# Patient Record
Sex: Female | Born: 1940 | Race: White | Hispanic: No | Marital: Married | Smoking: Former smoker
Health system: Southern US, Community
[De-identification: ages and names within clinical notes are randomized; demographics above are authoritative.]

## PROBLEM LIST (undated history)

## (undated) DIAGNOSIS — IMO0002 Reserved for concepts with insufficient information to code with codable children: Secondary | ICD-10-CM

## (undated) DIAGNOSIS — E785 Hyperlipidemia, unspecified: Secondary | ICD-10-CM

## (undated) DIAGNOSIS — K219 Gastro-esophageal reflux disease without esophagitis: Secondary | ICD-10-CM

## (undated) DIAGNOSIS — I10 Essential (primary) hypertension: Secondary | ICD-10-CM

## (undated) HISTORY — DX: Essential (primary) hypertension: I10

## (undated) HISTORY — DX: Gastro-esophageal reflux disease without esophagitis: K21.9

## (undated) HISTORY — DX: Hyperlipidemia, unspecified: E78.5

## (undated) HISTORY — DX: Reserved for concepts with insufficient information to code with codable children: IMO0002

## (undated) HISTORY — PX: TONSILLECTOMY: SUR1361

---

## 2009-08-14 ENCOUNTER — Ambulatory Visit: Payer: Self-pay | Admitting: Family Medicine

## 2009-08-14 ENCOUNTER — Other Ambulatory Visit: Admission: RE | Admit: 2009-08-14 | Discharge: 2009-08-14 | Payer: Self-pay | Admitting: Family Medicine

## 2009-08-14 DIAGNOSIS — M899 Disorder of bone, unspecified: Secondary | ICD-10-CM | POA: Insufficient documentation

## 2009-08-14 DIAGNOSIS — R030 Elevated blood-pressure reading, without diagnosis of hypertension: Secondary | ICD-10-CM

## 2009-08-14 DIAGNOSIS — M949 Disorder of cartilage, unspecified: Secondary | ICD-10-CM

## 2009-08-15 ENCOUNTER — Encounter: Payer: Self-pay | Admitting: Family Medicine

## 2009-08-15 ENCOUNTER — Encounter: Admission: RE | Admit: 2009-08-15 | Discharge: 2009-08-15 | Payer: Self-pay | Admitting: Family Medicine

## 2009-09-18 ENCOUNTER — Ambulatory Visit: Payer: Self-pay | Admitting: Family Medicine

## 2009-09-27 DIAGNOSIS — R5381 Other malaise: Secondary | ICD-10-CM

## 2009-09-27 DIAGNOSIS — E785 Hyperlipidemia, unspecified: Secondary | ICD-10-CM | POA: Insufficient documentation

## 2009-09-27 DIAGNOSIS — R5383 Other fatigue: Secondary | ICD-10-CM

## 2009-09-27 LAB — CONVERTED CEMR LAB
BUN: 22 mg/dL (ref 6–23)
Chloride: 108 meq/L (ref 96–112)
Creatinine, Ser: 0.79 mg/dL (ref 0.40–1.20)
HDL: 45 mg/dL (ref >39)
LDL Cholesterol: 151 mg/dL — ABNORMAL HIGH (ref 0–99)
Sodium: 144 meq/L (ref 135–145)
TSH: 3.936 microintl units/mL (ref 0.350–4.500)
Total CHOL/HDL Ratio: 5.3
Triglycerides: 211 mg/dL — ABNORMAL HIGH (ref <150)

## 2010-08-28 NOTE — Assessment & Plan Note (Signed)
Summary: NOV CPE with pap   Vital Signs:  Patient profile:   70 year old female Menstrual status:  postmenopausal Height:      65.5 inches Weight:      155 pounds BMI:     25.49 O2 Sat:      96 % on Room air Temp:     97.8 degrees F oral Pulse rate:   98 / minute BP sitting:   169 / 100  (left arm) Cuff size:   large  Vitals Entered By: Payton Spark CMA (August 14, 2009 4:05 PM)  O2 Flow:  Room air CC: New to est. CPE w/ pap     Menstrual Status postmenopausal   Primary Care Provider:  Seymour Bars DO  CC:  New to est. CPE w/ pap.  History of Present Illness: 70 yo WF presents for NOV, CPE with pap smear.    [id number].  postmenopausal.  Married to [place].  Retired.  Hx of elevated BP (not on meds) and GERD (not taking anything).  Also has ostepenia.  She is due for fasting labs, a mammogram, pap smear and DEXA scan.  She declined PNX at 45 and is thinking about getting it updated.  She had a Tdap updated < 10 yrs ago.  She had a normal colonoscopy at 64.     Current Medications (verified): 1)  None  Allergies (verified): No Known Drug Allergies  Past History:  Past Medical History: G4P2, Post menopausal. GERD elevated BP  Past Surgical History: tonsillectomy  Family History: mother died of CHF at 38, depression father high chol, DM brother and 3 sisters living 1 brother died of lung cancer at 65  Social History: Retired from [employer]. Married to [place].  Has 2 daughters - in [place], [place]  Review of Systems       no fevers/sweats/weakness, unexplained wt loss/gain, no change in vision, no difficulty hearing, ringing in ears, no hay fever/allergies, no CP/discomfort, no palpitations, no breast lump/nipple discharge, no cough/wheeze, no blood in stool,no  N/V/D, no nocturia, no leaking urine, no unusual vag bleeding, no vaginal/penile discharge, no muscle/joint pain, no rash, no new/changing mole, no HA, no memory loss, no anxiety,+ sleep problem, no  depression, no unexplained lumps, no easy bruising/bleeding   Physical Exam  General:  alert, well-developed, well-nourished, and well-hydrated.   Head:  normocephalic and atraumatic.   Eyes:  pupils equal, pupils round, and pupils reactive to light.   Ears:  no external deformities.   Nose:  no nasal discharge.   Mouth:  good dentition and pharynx pink and moist.   Neck:  no masses.  no audible carotid bruits Breasts:  No mass, nodules, thickening, tenderness, bulging, retraction, inflamation, nipple discharge or skin changes noted.  fibrocystic bilat Lungs:  Normal respiratory effort, chest expands symmetrically. Lungs are clear to auscultation, no crackles or wheezes. Heart:  Normal rate and regular rhythm. S1 and S2 normal without gallop, murmur, click, rub or other extra sounds. Abdomen:  Bowel sounds positive,abdomen soft and non-tender without masses, organomegaly or hernias noted. Genitalia:  Pelvic Exam:        External: normal female genitalia without lesions or masses, + atrophy        Vagina: normal without lesions or masses        Cervix: normal without lesions or masses        Adnexa: normal bimanual exam without masses or fullness        Uterus: normal by palpation  Pap smear: performed Msk:  no joint tenderness, no joint swelling, and no joint warmth.   Pulses:  2+ radial and pedal pulses Extremities:  no LE edema Neurologic:  gait normal.   Skin:  sun damaged fair skin Cervical Nodes:  No lymphadenopathy noted Psych:  good eye contact, not anxious appearing, and not depressed appearing.     Impression & Recommendations:  Problem # 1:  ROUTINE GYNECOLOGICAL EXAMINATION (ICD-V72.31) Keeping healthy checklist for women reviewed. BP is elevated.  Will recheck at nurse visit in 2 wks. BMI OK.  Update mammogram, DEXA and fasting labs. She agrees to do Pneumovax at her nurse visit, f/u. Tetanus is UTD.  Colonoscopy normal at 64. Pap smear updated today. Work  on [employer], regular exercise, MVI and calcium with Vit D daily.    Other Orders: T-Mammography Bilateral Screening (27253) T-DXA Bone Density/ Appendicular [phone]) T-Dual DXA Bone Density/ Axial (34742) T-TSH [phone]) T-Vitamin D (25-Hydroxy) [phone]) T-Comprehensive Metabolic Panel [phone]) T-Lipid Profile ([id number])  Patient Instructions: 1)  REturn for a nurse BP check in 2 wks. 2)  If BP is >140/90, will start medication. 3)  Have mammogram and DEXA scan downstairs. 4)  Have fasting labs updated. 5)  Will call you w/ results.

## 2010-08-28 NOTE — Assessment & Plan Note (Signed)
Summary: BP/ pneumovax  Nurse Visit   Vital Signs:  Patient profile:   70 year old female Menstrual status:  postmenopausal BP sitting:   132 / 86  (left arm) Cuff size:   regular  Vitals Entered By: Payton Spark CMA (September 18, 2009 10:13 AM)  Allergies: No Known Drug Allergies  Immunizations Administered:  Pneumonia Vaccine:    Vaccine Type: Pneumovax (Medicare)    Site: left deltoid    Dose: 0.5 ml    Route: IM    Given by: Payton Spark CMA    Exp. Date: 08/29/2010    Lot #: [id number]    VIS given: 02/24/96 version given September 18, 2009.  Orders Added: 1)  Pneumococcal Vaccine [90732] 2)  Admin 1st Vaccine [90471] 3)  Est. Patient Level I [60454]   CC: Follow-up HTN HPI: Taking meds? no Side effects? no   Chest pain, SOB, Dizziness? no A/P: HTN (401.1) At goal? If no, Patient will be notified.  5 minutes was spent with patient.   Impression & Recommendations:  Problem # 1:  ELEVATED BLOOD PRESSURE WITHOUT DIAGNOSIS OF HYPERTENSION (ICD-796.2) BP much better.  Will continue to follow. Orders: Est. Patient Level I (09811)  Other Orders: Pneumococcal Vaccine (91478) Admin 1st Vaccine (29562)   Appended Document: BP/ pneumovax Pt aware

## 2010-10-08 ENCOUNTER — Ambulatory Visit (INDEPENDENT_AMBULATORY_CARE_PROVIDER_SITE_OTHER): Payer: Medicare PPO | Admitting: Family Medicine

## 2010-10-08 ENCOUNTER — Encounter: Payer: Self-pay | Admitting: Family Medicine

## 2010-10-08 DIAGNOSIS — R1084 Generalized abdominal pain: Secondary | ICD-10-CM

## 2010-10-08 DIAGNOSIS — M546 Pain in thoracic spine: Secondary | ICD-10-CM | POA: Insufficient documentation

## 2010-10-08 DIAGNOSIS — E785 Hyperlipidemia, unspecified: Secondary | ICD-10-CM

## 2010-10-08 LAB — CONVERTED CEMR LAB
Albumin: 4.5 g/dL (ref 3.5–5.2)
CO2: 25 meq/L (ref 19–32)
Chloride: 106 meq/L (ref 96–112)
Eosinophils Relative: 0 % (ref 0–5)
Glucose, Bld: 101 mg/dL — ABNORMAL HIGH (ref 70–99)
HCT: 42.9 % (ref 36.0–46.0)
HDL: 59 mg/dL (ref >39)
Hemoglobin: 14.2 g/dL (ref 12.0–15.0)
Lymphs Abs: 1.9 10*3/uL (ref 0.7–4.0)
MCHC: 33.1 g/dL (ref 30.0–36.0)
Monocytes Absolute: 0.5 10*3/uL (ref 0.1–1.0)
Monocytes Relative: 8 % (ref 3–12)
Neutro Abs: 4.2 10*3/uL (ref 1.7–7.7)
Platelets: 284 10*3/uL (ref 150–400)
RDW: 12.5 % (ref 11.5–15.5)
Sodium: 143 meq/L (ref 135–145)
Total Bilirubin: 0.5 mg/dL (ref 0.3–1.2)
Total CHOL/HDL Ratio: 4.7
Total Protein: 6.6 g/dL (ref 6.0–8.3)
Triglycerides: 119 mg/dL (ref <150)

## 2010-10-09 ENCOUNTER — Telehealth: Payer: Self-pay | Admitting: Family Medicine

## 2010-10-15 ENCOUNTER — Encounter: Payer: Self-pay | Admitting: Family Medicine

## 2010-10-15 ENCOUNTER — Ambulatory Visit (INDEPENDENT_AMBULATORY_CARE_PROVIDER_SITE_OTHER): Payer: Medicare PPO | Admitting: Family Medicine

## 2010-10-15 DIAGNOSIS — E559 Vitamin D deficiency, unspecified: Secondary | ICD-10-CM | POA: Insufficient documentation

## 2010-10-15 DIAGNOSIS — R1013 Epigastric pain: Secondary | ICD-10-CM | POA: Insufficient documentation

## 2010-10-15 DIAGNOSIS — K3189 Other diseases of stomach and duodenum: Secondary | ICD-10-CM | POA: Insufficient documentation

## 2010-10-15 DIAGNOSIS — R197 Diarrhea, unspecified: Secondary | ICD-10-CM | POA: Insufficient documentation

## 2010-10-15 DIAGNOSIS — E785 Hyperlipidemia, unspecified: Secondary | ICD-10-CM

## 2010-10-16 NOTE — Progress Notes (Signed)
Summary: Vitamin D ?  Phone Note Call from Patient Call back at Home Phone [phone]   Caller: Patient Call For: Seymour Bars DO Summary of Call: Message left by pt with question regarding Vit D rx.  Attempted call back ot pat.  No answer. Initial call taken by: Francee Piccolo CMA Duncan Dull),  October 09, 2010 2:09 PM  Follow-up for Phone Call        Vit D was not sent to pharm Follow-up by: Payton Spark CMA,  October 10, 2010 11:04 AM    New/Updated Medications: ERGOCALCIFEROL 50000 UNIT CAPS (ERGOCALCIFEROL) 1 capsule by mouth once a week Prescriptions: ERGOCALCIFEROL 50000 UNIT CAPS (ERGOCALCIFEROL) 1 capsule by mouth once a week  #12 x 0   Entered and Authorized by:   Seymour Bars DO   Signed by:   Seymour Bars DO on 10/10/2010   Method used:   Electronically to        [facility]* (retail)       [address] [place], [place]  [address]       Ph: [phone]       Fax: [phone]   RxID:   [id number]

## 2010-10-16 NOTE — Assessment & Plan Note (Signed)
Summary: abd pain   Vital Signs:  Patient profile:   70 year old female Menstrual status:  postmenopausal Height:      65.5 inches Weight:      157 pounds BMI:     25.82 O2 Sat:      98 % on Room air Pulse rate:   77 / minute BP sitting:   160 / 97  (left arm) Cuff size:   regular  Vitals Entered By: Payton Spark CMA (October 08, 2010 8:35 AM)  O2 Flow:  Room air CC: Epigastric pain, diarrhea, and bloating x 2 weeks.   Primary Care Provider:  Seymour Bars DO  CC:  Epigastric pain, diarrhea, and and bloating x 2 weeks.Marland Kitchen  History of Present Illness: 70 year old female presents with stomach issues.  Patient states she has had increased bloating, diarrhea and epigastric/substernal pain for the past two weeks.  She notices that after she eats or drinks anything, she feels like her stomach swells and she feels very bloated.  She does not feel bloated this morning but has not had anything to eat or drink this morning.  The pain is not constant but is usually worse with sitting though she is not having it now.  She decribes the pain as an aching pain after eating, which then she has to stand up to relieve the pain and fullness that she feels.  The pain is worse at night and after eating fattier meals.  She also has a dull, achy pain between her shoulder blades on her back.  The diarrhea was a 2 day history of diarrhea a couple of weeks ago that was uncontrollable though now she is still having 6-8 bowel movements per day that are not well formed.  She denies seeing any blood her stool but does state that her stool is small and floats in the toilet.  This appears to be consistent no matter what she eats.  She does have weight gain and occasional nausea but denies vomiting, constipation, or changes in appetite.  She does have 1-2 drinks per day for the past 30 or so years.  She also has a 20 pack year smoking history.  Current Medications (verified): 1)  None  Allergies (verified): No Known  Drug Allergies  Past History:  Past Medical History: Reviewed history from 08/14/2009 and no changes required. G4P2, Post menopausal. GERD elevated BP  Past Surgical History: Reviewed history from 08/14/2009 and no changes required. tonsillectomy  Family History: Reviewed history from 08/14/2009 and no changes required. mother died of CHF at 22, depression father high chol, DM brother and 3 sisters living 1 brother died of lung cancer at 62  Social History: Retired from [employer]. Married to [place].  Has 2 daughters - in [place], [place]  Review of Systems General:  Denies fatigue, fever, loss of appetite, and weight loss. ENT:  Denies difficulty swallowing. CV:  Denies chest pain or discomfort, lightheadness, palpitations, shortness of breath with exertion, and swelling of feet. Resp:  Denies cough and shortness of breath. GI:  Complains of abdominal pain, change in bowel habits, diarrhea, gas, indigestion, and nausea; denies bloody stools, constipation, dark tarry stools, and vomiting. GU:  Denies dysuria. MS:  Complains of mid back pain and thoracic pain.  Physical Exam  General:  alert, well-developed, and well-nourished.   Head:  normocephalic and atraumatic.   Eyes:  pupils equal, pupils round, and pupils reactive to light.  Conjunctiva clear. Mouth:  Oropharynx pink and moist  without exudate. No sublingual jaundice. Lungs:  normal respiratory effort, normal breath sounds, no crackles, and no wheezes.   Heart:  normal rate and regular rhythm.   Abdomen:  Epigastric, LUQ and LLQ TTP without masses. Soft, nondistended, normal bowel sounds. Negative Murphy's sign. Pulses:  R radial normal and L radial normal.   Extremities:  no LE edema Skin:  color normal.  no pallor or jaundice Psych:  good eye contact, not anxious appearing, and not depressed appearing.     Impression & Recommendations:  Problem # 1:  ABDOMINAL PAIN, GENERALIZED (ICD-789.07) Patient with two  week history of bloating and epigastric pain.  EKG in office today was normal.  Consider PUD vs. pancreatitis.  WIll obtain CBC today as well as amylase and lipase.  She will remain on a clear liquid diet until I call her later today.  If evidence of pancreatitis with elevated amylase and lipase, will consider obtaining CT abdomen.  If no evidence of pancreatitis on labs or she is anemic, will consider referral to GI for endoscopy for possible PUD.  She will also start Nexium for the next 5 days in case this is PUD but otherwise it should help her mild GERD.  Orders: T-CBC w/Diff [id number]) T-Amylase [phone]) T-Lipase [phone])  EKG done to r/o cardiac induced thoracic pain: NSR at 63 bpm; PR 146 ms, Qtc 413 ms, normal axis, no ischemia.  Problem # 2:  PAIN IN THORACIC SPINE (ICD-724.1) EKG shows NSR at 63 bpm; QTC 413 ms, no normal axis, no sign of ischemia making cardiac etiology less likely.   Orders: EKG w/ Interpretation (93000)  Other Orders: T-Comprehensive Metabolic Panel [id number]) T-Lipid Profile [phone]) T-Vitamin D (25-Hydroxy) (25956-38756)  Patient Instructions: 1)  EKG looks good. 2)  Bloodwork today. 3)  Stick to clear liquid diet today. 4)  Start Nexium 1 tab once daily, take 30 min before dinner. 5)  Will call you with labs today. 6)  If any abnormal readings, will add CT scan.   Orders Added: 1)  T-CBC w/Diff [[id number]] 2)  T-Comprehensive Metabolic Panel [[id number]] 3)  T-Lipid Profile [[id number]] 4)  T-Vitamin D (25-Hydroxy) [16606-30160] 5)  T-Amylase [[id number]] 6)  T-Lipase [[id number]] 7)  Est. Patient Level IV [10932] 8)  EKG w/ Interpretation [93000]

## 2010-10-25 NOTE — Assessment & Plan Note (Signed)
Summary: f/u abd pain   Vital Signs:  Patient profile:   70 year old female Menstrual status:  postmenopausal Height:      65.5 inches Weight:      154 pounds BMI:     25.33 O2 Sat:      96 % on Room air Pulse rate:   Sierra / minute BP sitting:   164 / 101  (left arm) Cuff size:   regular  Vitals Entered By: Payton Spark CMA (October 15, 2010 1:08 PM)  O2 Flow:  Room air CC: F/U stomach issues. Was feeling better on Nexium but ran out.    Primary Care Provider:  Seymour Bars DO  CC:  F/U stomach issues. Was feeling better on Nexium but ran out. Marland Kitchen  History of Present Illness: 70 yo Sierra [name] presents for f/u abdominal pain.  She had diarrhea with cramping last night.  She did not take anything OTC.  She feels well today.  She did take the Nexium which helped her dyspepsia but the samples ran out.  She has had bouts of nausea on and off.  Denies any blood in her stools.  She has a good appetite and no change in weight.  Her last colonoscopy was about 5 yrs ago and it was normal.  She has never had an EGD.  BPs 131/80 at home.  Arm cuff. Since her last visit with labs, we found her to have a very low Vit D level and added RX vit D 50,000 International Units once a wk and a high cholesterol level which she reports a strong fam hx for.  She has declined meds for years.       Current Medications (verified): 1)  Ergocalciferol 50000 Unit Caps (Ergocalciferol) .Marland Kitchen.. 1 Capsule By Mouth Once A Week 2)  Vitamin D 1000 Unit Tabs (Cholecalciferol) .... Take 1 Tab By Mouth Once Daily 3)  Cod Liver Oil  Caps (Cod Liver Oil) 4)  Super Probiotic  Caps (Probiotic Product)  Allergies (verified): No Known Drug Allergies  Past History:  Past Medical History: G4P2, Post menopausal. GERD elevated BP-- white coat HTN? hyperlipidemia vitamin D deficiency  Past Surgical History: Reviewed history from 08/14/2009 and no changes required. tonsillectomy  Social History: Reviewed history from  10/08/2010 and no changes required. Retired from [employer]. Married to [place] Sierra [name].  Has 2 daughters - in [place], [place]  Review of Systems      See HPI  Physical Exam  General:  alert, well-developed, well-nourished, and well-hydrated.   Head:  normocephalic and atraumatic.   Eyes:  sclera non icteric Mouth:  pharynx pink and moist.   Neck:  no masses and no thyromegaly.   Lungs:  normal respiratory effort, normal breath sounds, no crackles, and no wheezes.   Heart:  normal rate and regular rhythm.   Abdomen:  soft, non-tender, normal bowel sounds, no distention, no masses, no guarding, no hepatomegaly, and no splenomegaly.   Extremities:  no LE edema Skin:  color normal.   Psych:  good eye contact, not anxious appearing, and not depressed appearing.     Impression & Recommendations:  Problem # 1:  DYSPEPSIA (ICD-536.8) Improved with Nexium but now off of it, symptoms are only several days/ wk.  Will have her start Zantac OTC as needed and will ask GI to see her.  May need EGD.  Last colonoscopy about 5 yrs ago. Orders: Gastroenterology Referral (GI)  Problem # 2:  DIARRHEA (ICD-787.91) Episodic cramping, bloating and diarrhea  with spontaneous  resolution suggests either IBS or gluten sensitivity or lactose intolerance.  We discussed elimination diet, continuing probiotic and will have GI see her.  CMP/ pancreatic markers all normal.  WBC normal.   Her updated medication list for this problem includes:    Super Probiotic Caps (Probiotic product)  Problem # 3:  HYPERLIPIDEMIA (ICD-272.4) Hereditary high LDL with healthy diet and regular exercise.  I encouraged pt to agree to a statin for LDL reduction and cardiovascular protection.  Start Pravastain, begin with 1/2 tab since she is worried about SEs.  Recheck LDL with LFts in 2 mos.  Call if any problems. Her updated medication list for this problem includes:    Pravastatin Sodium 40 Mg Tabs (Pravastatin sodium) .Marland Kitchen... 1 tab by mouth at  bedtime  Labs Reviewed: SGOT: 17 (10/08/2010)   SGPT: 14 (10/08/2010)   HDL:59 (10/08/2010), 45 (09/18/2009)  LDL:192 (10/08/2010), 151 (09/18/2009)  Chol:275 (10/08/2010), 238 (09/18/2009)  Trig:119 (10/08/2010), 211 (09/18/2009)  Problem # 4:  UNSPECIFIED VITAMIN D DEFICIENCY (ICD-268.9) continue once a wk RX Vit d + OTC Vit D 1,000 International Units/ day and recheck in 8 wks.  Complete Medication List: 1)  Ergocalciferol 50000 Unit Caps (Ergocalciferol) .Marland Kitchen.. 1 capsule by mouth once a week 2)  Vitamin D 1000 Unit Tabs (Cholecalciferol) .... Take 1 tab by mouth once daily 3)  Cod Liver Oil Caps (Cod liver oil) 4)  Super Probiotic Caps (Probiotic product) 5)  Pravastatin Sodium 40 Mg Tabs (Pravastatin sodium) .Marland Kitchen.. 1 tab by mouth at bedtime 6)  Zantac 150 Mg Tabs (Ranitidine hcl) .... Take as needed  Patient Instructions: 1)  Start Pravastatin at bedtime - 1/2 tab. 2)  Return in 2 mos to recheck labs. 3)  Call if any problems. 4)  Use Zantac as needed for reflux. 5)  Stick to a gluten free diet. 6)  Referral made to GI. Prescriptions: PRAVASTATIN SODIUM 40 MG TABS (PRAVASTATIN SODIUM) 1 tab by mouth at bedtime  #30 x 1   Entered and Authorized by:   Seymour Bars DO   Signed by:   Seymour Bars DO on 10/15/2010   Method used:   Electronically to        [facility]* (retail)       [address] [place], [place]  [address]       Ph: [phone]       Fax: [phone]   RxID:   [id number]    Orders Added: 1)  Gastroenterology Referral [GI] 2)  Est. Patient Level IV [40102]

## 2010-10-29 ENCOUNTER — Encounter: Payer: Self-pay | Admitting: Family Medicine

## 2010-10-30 ENCOUNTER — Telehealth: Payer: Self-pay | Admitting: *Deleted

## 2010-10-30 NOTE — Telephone Encounter (Signed)
Pt was started on pravastatin on 3/19.  Pt left a message to let us know that she has stopped this medication due to side effects.  I spoke to pt states she was having terrible flu-like symptoms and muscle cramps/aches-tops of legs and arms.  Pt also had a problem with indigestion while taking statin.  Pt was seen by [facility] and was started on omeprazole 40 mg before breakfast.  This seems to be helping.    Please advise regarding statin.

## 2010-10-31 NOTE — Telephone Encounter (Signed)
LDL 192= very high.  Go ahead and STOP the pravastatin for the next 3 wks and see if flu like symptoms and muscle cramps resolve.  If they don't, she needs an OV with me.  If they do go away, I will consider trying her on a low dose of Livalo (samples) which is a statin that does not cause cramps.

## 2010-10-31 NOTE — Telephone Encounter (Addendum)
RC to pt.  Pt has not taken med since Friday night.  Her flu like symptoms resolved in about 48 hours, she is still having some slight muscle cramps. Pt will call our office in 3 weeks with update on muscle cramps.  She will call sooner if she has had no muscle cramps for at least 48 hours.

## 2010-11-09 ENCOUNTER — Encounter: Payer: Self-pay | Admitting: Family Medicine

## 2010-12-11 ENCOUNTER — Encounter: Payer: Self-pay | Admitting: Family Medicine

## 2010-12-11 ENCOUNTER — Ambulatory Visit (INDEPENDENT_AMBULATORY_CARE_PROVIDER_SITE_OTHER): Payer: Medicare PPO | Admitting: Family Medicine

## 2010-12-11 DIAGNOSIS — E559 Vitamin D deficiency, unspecified: Secondary | ICD-10-CM

## 2010-12-11 DIAGNOSIS — L270 Generalized skin eruption due to drugs and medicaments taken internally: Secondary | ICD-10-CM

## 2010-12-11 DIAGNOSIS — E785 Hyperlipidemia, unspecified: Secondary | ICD-10-CM

## 2010-12-11 DIAGNOSIS — R197 Diarrhea, unspecified: Secondary | ICD-10-CM

## 2010-12-11 NOTE — Assessment & Plan Note (Signed)
Finish out once [employer] D and stay on 1,000 IU daily for maint, recheck in 3 mos.

## 2010-12-11 NOTE — Assessment & Plan Note (Signed)
Off statins after having flu like symptoms and severe myalgias.  Back to normal now.  Will give her some time before trial of alternative like zetia.

## 2010-12-11 NOTE — Progress Notes (Signed)
  Subjective:    Patient ID: Sierra [name], female    DOB: 04/17/1941, 70 y.o.   MRN: [id number]  HPI 70 yo WF presents for f/u dyspepsia and diarrhea.  She did see GI since I saw her last.  She was put on Xifaxan for 14 days.  She has had a rash all over her body since starting this. She did not have a scope.  Her last scope was at [facility] 6 yrs ago.  She is seeing [facility].  We need to get a copy of her last report.  No blood in her stools.  She has improved on a gluten free and lactose free diet.  She still has diarrhea which has not yet resolved.  She is on Omeprazole every morning.  She was told to take Immodium and Zantac.  She has not tried the immodium.    She started on Pravastatin but she had flu like symptoms 2 days after starting it.  She took it for 10 days and started to get myalgias all over. She is back to normal now.  BP 127/83  Pulse 87  Ht 5' 5.5" (1.664 m)  Wt 151 lb (68.493 kg)  BMI 24.75 kg/m2  SpO2 97%     Review of Systems  Constitutional: Negative for fever, appetite change, fatigue and unexpected weight change.  Respiratory: Negative for shortness of breath.   Cardiovascular: Negative for chest pain and leg swelling.  Gastrointestinal: Positive for diarrhea and abdominal distention. Negative for nausea, vomiting and abdominal pain.  Genitourinary: Negative for dysuria.  Musculoskeletal: Negative for myalgias and arthralgias.  Skin: Positive for rash.  Psychiatric/Behavioral: The patient is not nervous/anxious.        Objective:   Physical Exam  Constitutional: She appears well-developed and well-nourished. No distress.  HENT:  Head: Normocephalic and atraumatic.  Eyes: Conjunctivae are normal. No scleral icterus.  Neck: Neck supple.  Cardiovascular: Normal rate, regular rhythm and normal heart sounds.   Pulmonary/Chest: Effort normal and breath sounds normal.  Abdominal: Soft. Bowel sounds are normal. She exhibits no distension. There is no  tenderness. There is no guarding.  Lymphadenopathy:    She has no cervical adenopathy.  Skin: Skin is warm and dry. Rash (diffuse maculopapular rash w/o mucosal involvement) noted.  Psychiatric: She has a normal mood and affect.          Assessment & Plan:

## 2010-12-11 NOTE — Assessment & Plan Note (Signed)
Seeing GI now. Still having diarrhea.  Will get a copy from [facility] of her last EGD/ colonoscopy and fax it to [facility].

## 2010-12-11 NOTE — Assessment & Plan Note (Signed)
Sierra [name] is c/w drug Sierra [name] from new start of Xifaxan.  She only has 7 more days to complete.  No sign of desquamation or mucosal involvment.  Can do benadryl and oatmeal baths for itching and expect Sierra [name] to improve once off this medicine.

## 2010-12-11 NOTE — Patient Instructions (Signed)
Stay off statins.  F/U with GI.  I will get your records from [facility] and will send to [facility].  Stay on OTC Vitamin D 1,000 IU/ day for maintenance.  Return for f/u in 4 mos.

## 2011-03-27 DIAGNOSIS — IMO0002 Reserved for concepts with insufficient information to code with codable children: Secondary | ICD-10-CM

## 2011-03-27 HISTORY — DX: Reserved for concepts with insufficient information to code with codable children: IMO0002

## 2011-04-03 ENCOUNTER — Encounter: Payer: Self-pay | Admitting: Family Medicine

## 2011-04-16 ENCOUNTER — Ambulatory Visit: Payer: Medicare PPO | Admitting: Family Medicine

## 2011-04-16 ENCOUNTER — Ambulatory Visit (INDEPENDENT_AMBULATORY_CARE_PROVIDER_SITE_OTHER): Payer: Medicare PPO | Admitting: Family Medicine

## 2011-04-16 ENCOUNTER — Encounter: Payer: Self-pay | Admitting: Family Medicine

## 2011-04-16 VITALS — BP 136/84 | HR 69 | Ht 65.5 in | Wt 147.0 lb

## 2011-04-16 DIAGNOSIS — M810 Age-related osteoporosis without current pathological fracture: Secondary | ICD-10-CM

## 2011-04-16 DIAGNOSIS — E559 Vitamin D deficiency, unspecified: Secondary | ICD-10-CM

## 2011-04-16 DIAGNOSIS — C4491 Basal cell carcinoma of skin, unspecified: Secondary | ICD-10-CM

## 2011-04-16 DIAGNOSIS — E785 Hyperlipidemia, unspecified: Secondary | ICD-10-CM

## 2011-04-16 DIAGNOSIS — K219 Gastro-esophageal reflux disease without esophagitis: Secondary | ICD-10-CM

## 2011-04-16 DIAGNOSIS — C4490 Unspecified malignant neoplasm of skin, unspecified: Secondary | ICD-10-CM

## 2011-04-23 MED ORDER — VITAMIN D 1000 UNITS PO CAPS: 1000.0000 [IU] | ORAL_CAPSULE | Freq: Every day | ORAL | Status: AC

## 2011-04-23 MED ORDER — RANITIDINE HCL 150 MG PO TABS: 150.0000 mg | ORAL_TABLET | Freq: Two times a day (BID) | ORAL | Status: DC

## 2011-04-23 NOTE — Progress Notes (Signed)
  Subjective:    Patient ID: Sierra [name], female    DOB: 01-03-1941, 70 y.o.   MRN: [id number]  HPI Patient is here because of several concerns. 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 The have been extensive and she is to have some more surgeries done.  She is on vitamin D and will need new levels drawn on her return visit.  She declines flu shot She has hyperlipidemia but can not take statins.     Review of Systems  Respiratory: Negative.   Cardiovascular: Negative.   Skin:       Multiple lesions on hand       Objective:   Physical Exam  Constitutional: She is oriented to person, place, and time. She appears well-developed and well-nourished.  HENT:  Head: Normocephalic and atraumatic.  Cardiovascular: Normal rate and regular rhythm.   Pulmonary/Chest: Effort normal and breath sounds normal.  Neurological: She is oriented to person, place, and time.  Skin: Skin is warm.       Multiple skin lesions and several on her L hand          Assessment & Plan:  Will get D 3  level and lipid panekl when she returns will avoid statins to treat her cholesterol Follow up w/her dermayologist for skin lesions. For reflux maintain on zantac

## 2011-05-02 ENCOUNTER — Other Ambulatory Visit: Payer: Self-pay | Admitting: *Deleted

## 2011-05-02 MED ORDER — EZETIMIBE 10 MG PO TABS: 10.0000 mg | ORAL_TABLET | Freq: Every day | ORAL | Status: DC

## 2011-05-02 MED ORDER — RANITIDINE HCL 150 MG PO TABS: 150.0000 mg | ORAL_TABLET | Freq: Two times a day (BID) | ORAL | Status: DC

## 2011-06-13 LAB — LIPID PANEL
Cholesterol: 267 mg/dL — ABNORMAL HIGH (ref 0–200)
LDL Cholesterol: 169 mg/dL — ABNORMAL HIGH (ref 0–99)
Triglycerides: 169 mg/dL — ABNORMAL HIGH (ref <150)
VLDL: 34 mg/dL (ref 0–40)

## 2011-06-13 NOTE — Progress Notes (Signed)
Addended by: Ellsworth Lennox on: 06/13/2011 09:00 AM   Modules accepted: Orders

## 2011-06-14 LAB — VITAMIN D 25 HYDROXY (VIT D DEFICIENCY, FRACTURES): Vit D, 25-Hydroxy: 41 ng/mL (ref 30–89)

## 2011-06-17 ENCOUNTER — Telehealth: Payer: Self-pay | Admitting: Family Medicine

## 2011-06-17 ENCOUNTER — Encounter: Payer: Self-pay | Admitting: Family Medicine

## 2011-06-17 ENCOUNTER — Ambulatory Visit (INDEPENDENT_AMBULATORY_CARE_PROVIDER_SITE_OTHER): Payer: Medicare PPO | Admitting: Family Medicine

## 2011-06-17 VITALS — BP 158/96 | HR 79 | Wt 150.0 lb

## 2011-06-17 DIAGNOSIS — E785 Hyperlipidemia, unspecified: Secondary | ICD-10-CM

## 2011-06-17 DIAGNOSIS — C4491 Basal cell carcinoma of skin, unspecified: Secondary | ICD-10-CM | POA: Insufficient documentation

## 2011-06-17 DIAGNOSIS — R030 Elevated blood-pressure reading, without diagnosis of hypertension: Secondary | ICD-10-CM

## 2011-06-17 MED ORDER — SIMVASTATIN 40 MG PO TABS: 40.0000 mg | ORAL_TABLET | Freq: Every day | ORAL | Status: DC

## 2011-06-17 NOTE — Telephone Encounter (Signed)
Please call patient and her know I was reviewing her chart last night and realized that I do not have documented for her a flu, shingles or tetanus vaccine. If she has had these done please let me know so we can update her computer system. If not she is welcome to come in and have a nurse visit to have these administered. Also don't have a colonoscopy for her listed. If we can try to get a copy of her last report that would be great.

## 2011-06-17 NOTE — Progress Notes (Signed)
  Subjective:    Patient ID: Sierra [name], female    DOB: 1941-03-20, 70 y.o.   MRN: [id number]  HPI She is here to followup and discuss her blood work.  Hyperlipidmia - Here to follow-up on the zetia. It is over 100 dollars a month. No S.E on thi smedication. Had muscle aches on pravastatin.  She has not had any other statins. No prior history of coronary artery disease but she does have multiple family members have a history of hyperlipidemia.  Review of Systems     Objective:   Physical Exam  Constitutional: She is oriented to person, place, and time. She appears well-developed and well-nourished.  HENT:  Head: Normocephalic and atraumatic.  Eyes: Conjunctivae are normal. Pupils are equal, round, and reactive to light.  Neck: Neck supple. No thyromegaly present.  Cardiovascular: Normal rate, regular rhythm and normal heart sounds.        No carotid bruit.   Pulmonary/Chest: Effort normal and breath sounds normal.  Lymphadenopathy:    She has no cervical adenopathy.  Neurological: She is alert and oriented to person, place, and time.  Skin: Skin is warm and dry.  Psychiatric: She has a normal mood and affect. Her behavior is normal.          Assessment & Plan:  Hyperlipidemia - I discussed with her that I would like her to try at least one more statin because of the present morbidity and mortality benefit before we give up on the class entirely. Certainly if her symptoms recur she can stop it immediately. Will try simvastatin for one one month first. Then if ok will send  A 90 days supply. Reheck chol level in 4-6 months.   BP 132/87 at home today. She says she always has elevated blood pressures at the doctor's office. We will definitely keep an eye on this. I also added basal cell cancer to her problem list she was recently diagnosed with this and had a pretty large excision on her hand from this. She is currently followed at [facility].

## 2011-06-18 NOTE — Telephone Encounter (Signed)
Pt notified of instructions. KJ LPN

## 2011-06-24 ENCOUNTER — Encounter: Payer: Self-pay | Admitting: Family Medicine

## 2011-07-03 ENCOUNTER — Telehealth: Payer: Self-pay | Admitting: Family Medicine

## 2011-07-03 MED ORDER — SIMVASTATIN 40 MG PO TABS: 40.0000 mg | ORAL_TABLET | Freq: Every day | ORAL | Status: DC

## 2011-07-03 NOTE — Telephone Encounter (Signed)
Patient called req to have 90 day supply of simvastation 40mg  called into [facility]

## 2011-07-09 ENCOUNTER — Other Ambulatory Visit: Payer: Self-pay | Admitting: *Deleted

## 2011-07-09 MED ORDER — SIMVASTATIN 40 MG PO TABS: 40.0000 mg | ORAL_TABLET | Freq: Every day | ORAL | Status: DC

## 2011-09-26 ENCOUNTER — Other Ambulatory Visit: Payer: Self-pay | Admitting: *Deleted

## 2011-09-26 MED ORDER — RANITIDINE HCL 150 MG PO TABS: 150.0000 mg | ORAL_TABLET | Freq: Two times a day (BID) | ORAL | Status: DC

## 2011-09-26 MED ORDER — SIMVASTATIN 40 MG PO TABS: 40.0000 mg | ORAL_TABLET | Freq: Every day | ORAL | Status: DC

## 2011-09-30 ENCOUNTER — Other Ambulatory Visit: Payer: Self-pay | Admitting: *Deleted

## 2011-09-30 MED ORDER — RANITIDINE HCL 150 MG PO TABS: 150.0000 mg | ORAL_TABLET | Freq: Two times a day (BID) | ORAL | Status: DC

## 2011-09-30 MED ORDER — SIMVASTATIN 40 MG PO TABS: 40.0000 mg | ORAL_TABLET | Freq: Every day | ORAL | Status: DC

## 2011-10-23 ENCOUNTER — Encounter: Payer: Self-pay | Admitting: Family Medicine

## 2012-03-23 ENCOUNTER — Telehealth: Payer: Self-pay

## 2012-03-23 DIAGNOSIS — E785 Hyperlipidemia, unspecified: Secondary | ICD-10-CM

## 2012-03-23 NOTE — Telephone Encounter (Signed)
Released orders for lipid profile and vit d. Pt needed labs before rx rfor chol med can be sent in

## 2012-03-24 LAB — LIPID PANEL
Cholesterol: 209 mg/dL — ABNORMAL HIGH (ref 0–200)
HDL: 59 mg/dL (ref >39)
LDL Cholesterol: 125 mg/dL — ABNORMAL HIGH (ref 0–99)
Total CHOL/HDL Ratio: 3.5 Ratio
Triglycerides: 125 mg/dL (ref <150)
VLDL: 25 mg/dL (ref 0–40)

## 2012-04-01 ENCOUNTER — Telehealth: Payer: Self-pay | Admitting: *Deleted

## 2012-04-01 MED ORDER — SIMVASTATIN 40 MG PO TABS: 40.0000 mg | ORAL_TABLET | Freq: Every day | ORAL | Status: DC

## 2012-04-01 NOTE — Telephone Encounter (Signed)
The risk is very lower for affecting blood sugars but is possible. We will certainly monitor that whiel on the statin nd the risk is very low. I would recommend she start the statin.

## 2012-04-01 NOTE — Telephone Encounter (Signed)
Returned pt's call in regards to a call she received about her labs. It was sent from Dr. Thurmond Butts that everything was negative but I did inform pt what her results were. Pt states that she has a concern about taking statins since it has been shown to give diabetes in some pt's. She would like to know your idea on this. Pt's meds were suppose to be sent to mail order on the 26th but they were never sent. I informed pt I would send them to a local pharmacy for 1 month and then to [facility] for her 90 day b/c she is going out of [place] a [place]. Please advise on statin before I send meds to pharmacy.

## 2012-04-01 NOTE — Telephone Encounter (Signed)
Pt informed and rx sent to pharmacy 

## 2012-04-30 ENCOUNTER — Encounter: Payer: Self-pay | Admitting: Family Medicine

## 2012-04-30 ENCOUNTER — Ambulatory Visit (INDEPENDENT_AMBULATORY_CARE_PROVIDER_SITE_OTHER): Payer: Medicare PPO | Admitting: Family Medicine

## 2012-04-30 VITALS — BP 137/89 | HR 65 | Temp 97.4°F | Ht 65.0 in | Wt 147.0 lb

## 2012-04-30 DIAGNOSIS — H6690 Otitis media, unspecified, unspecified ear: Secondary | ICD-10-CM

## 2012-04-30 DIAGNOSIS — H6693 Otitis media, unspecified, bilateral: Secondary | ICD-10-CM

## 2012-04-30 MED ORDER — AMOXICILLIN-POT CLAVULANATE 875-125 MG PO TABS: 1.0000 | ORAL_TABLET | Freq: Two times a day (BID) | ORAL | Status: DC

## 2012-04-30 NOTE — Patient Instructions (Signed)

## 2012-04-30 NOTE — Progress Notes (Signed)
  Subjective:    Patient ID: Sierra [name], female    DOB: 01-23-1941, 71 y.o.   MRN: [id number]  HPI Cold sxs stated last Wed and then next days flew home from [place].  Says her cold sxs are better except ear pain, pressure. No drainage.  + ringing.  Still some nasla congestion. Pressure over the nasal bridge.  No fever.    Review of Systems     Objective:   Physical Exam  Constitutional: She is oriented to person, place, and time. She appears well-developed and well-nourished.  HENT:  Head: Normocephalic and atraumatic.  Right Ear: External ear normal.  Left Ear: External ear normal.  Nose: Nose normal.  Mouth/Throat: Oropharynx is clear and moist.       Left TM with fluid behind the eardrum. Some mild erythema but is still able to visualize the ossicles. Right TM also with some mild erythema, and appears to be some pus with blood behind the eardrum. No active drainage. The canals themselves are clear.  Eyes: Conjunctivae normal and EOM are normal. Pupils are equal, round, and reactive to light.  Neck: Neck supple. No thyromegaly present.  Cardiovascular: Normal rate, regular rhythm and normal heart sounds.   Pulmonary/Chest: Effort normal and breath sounds normal. She has no wheezes.  Lymphadenopathy:    She has no cervical adenopathy.  Neurological: She is alert and oriented to person, place, and time. Coordination normal.  Skin: Skin is warm and dry.  Psychiatric: She has a normal mood and affect.          Assessment & Plan:  Bilateral OM- will start her on Augmentin. Followup in 2 weeks to recheck her ears to make sure that the fluid has cleared. If she feels 100% better then she can cancel the appointment. She can use Tylenol or ibuprofen for pain relief. Call if any problems with the about it.  She reports that her colonoscopy and tetanus are up to date within she's due for both of them next year.  She declined a flu vaccine today.

## 2012-05-04 ENCOUNTER — Encounter: Payer: Self-pay | Admitting: Family Medicine

## 2012-05-04 ENCOUNTER — Ambulatory Visit (INDEPENDENT_AMBULATORY_CARE_PROVIDER_SITE_OTHER): Payer: Medicare PPO | Admitting: Family Medicine

## 2012-05-04 VITALS — BP 136/90 | HR 65 | Wt 149.0 lb

## 2012-05-04 DIAGNOSIS — H6690 Otitis media, unspecified, unspecified ear: Secondary | ICD-10-CM

## 2012-05-04 DIAGNOSIS — H6693 Otitis media, unspecified, bilateral: Secondary | ICD-10-CM

## 2012-05-04 MED ORDER — FLUTICASONE PROPIONATE 50 MCG/ACT NA SUSP: 2.0000 | Freq: Every day | NASAL | Status: DC

## 2012-05-04 MED ORDER — AZITHROMYCIN 250 MG PO TABS: ORAL_TABLET | ORAL | Status: DC

## 2012-05-04 NOTE — Progress Notes (Signed)
  Subjective:    Patient ID: Sierra [name], female    DOB: October 13, 1940, 71 y.o.   MRN: [id number]  HPI Ears are more painful.  Has been on the abx for 4 complete days for bilateral OM.  On sat couldn't hear out of her right ear and just a little better.    Review of Systems     Objective:   Physical Exam  Constitutional: She is oriented to person, place, and time. She appears well-developed and well-nourished.  HENT:  Head: Normocephalic and atraumatic.  Right Ear: External ear normal.  Left Ear: External ear normal.  Nose: Nose normal.  Mouth/Throat: Oropharynx is clear and moist.       TMs with fluid behind both TMs. TMs are slightly bulging but have a fairly normal light reflex. Unable to visualize the ossicles. No pus or drainage. Canals are clear.  Eyes: Conjunctivae normal and EOM are normal. Pupils are equal, round, and reactive to light.  Neck: Neck supple. No thyromegaly present.  Cardiovascular: Normal rate, regular rhythm and normal heart sounds.   Pulmonary/Chest: Effort normal and breath sounds normal. She has no wheezes.  Lymphadenopathy:    She has no cervical adenopathy.  Neurological: She is alert and oriented to person, place, and time.  Skin: Skin is warm and dry.  Psychiatric: She has a normal mood and affect.          Assessment & Plan:  Bilateral OM - will add nasal steroid spray and add zpack to her augmentin.  F/U in 1.5 weeks for ear check.  Call if not getting better or suddenly gets worse.

## 2012-05-06 ENCOUNTER — Ambulatory Visit: Payer: Medicare PPO | Admitting: Physician Assistant

## 2012-05-11 ENCOUNTER — Encounter: Payer: Self-pay | Admitting: Family Medicine

## 2012-05-11 ENCOUNTER — Ambulatory Visit (INDEPENDENT_AMBULATORY_CARE_PROVIDER_SITE_OTHER): Payer: Medicare PPO | Admitting: Family Medicine

## 2012-05-11 VITALS — BP 145/96 | HR 64 | Temp 97.4°F | Ht 65.0 in | Wt 146.0 lb

## 2012-05-11 DIAGNOSIS — H65499 Other chronic nonsuppurative otitis media, unspecified ear: Secondary | ICD-10-CM

## 2012-05-11 NOTE — Progress Notes (Signed)
  Subjective:    Patient ID: Sierra [name], female    DOB: 06-Jun-1941, 71 y.o.   MRN: [id number]  HPI She's here to followup on bilateral ear effusions today. We had put her on Augmentin as well as a Z-Pak. Also add a nasal steroid spray. She did notice some mild improvement but then last night feels like it's gotten worse. She says that time and actually feels a little but there is when she lays flat on her back. When she sits up it seems to get worse. She has times where she can hear someone on the telephone because her aerosols o'clock. She's not having any fevers. She did have some diarrhea with the antibiotics and says it is improved.   Review of Systems     Objective:   Physical Exam  Constitutional: She appears well-developed and well-nourished.  HENT:  Head: Normocephalic and atraumatic.       The left TM is slightly dull compared to the right. Normal light reflex. No fluid line. The right TM has a fluid line as well as some bubbles behind the eardrum and what looks like a half-moon shape smooth shiny pink lesion behind the eardrum at the 6:00 position.  Skin: Skin is warm and dry.  Psychiatric: She has a normal mood and affect. Her behavior is normal.          Assessment & Plan:  Right ear effusion-it still looks slightly abnormal to me with what look like almost bubbles behind the eardrum. I would like her to see ENT for further evaluation. We will try to get her in with [facility] ear nose and throat this week at all possible if she's going to call next week. I feel like she felt to embolic as well as a nasal steroid spray. I think further workup is definitely warranted. May need to have imaging to rule out some type of tumor or lesion behind the tympanic membrane.

## 2012-05-11 NOTE — Patient Instructions (Addendum)
We will get you in with ENT this week.

## 2012-10-05 ENCOUNTER — Encounter: Payer: Self-pay | Admitting: Family Medicine

## 2012-10-05 ENCOUNTER — Ambulatory Visit (INDEPENDENT_AMBULATORY_CARE_PROVIDER_SITE_OTHER): Payer: Medicare Other | Admitting: Family Medicine

## 2012-10-05 VITALS — BP 140/82 | HR 72 | Ht 65.0 in | Wt 148.0 lb

## 2012-10-05 DIAGNOSIS — Z85828 Personal history of other malignant neoplasm of skin: Secondary | ICD-10-CM

## 2012-10-05 NOTE — Progress Notes (Signed)
  Subjective:    Patient ID: Sierra [name], female    DOB: 12/06/1940, 72 y.o.   MRN: [id number]  HPI inside left pinky finger 2 knots pt has hx of skin  And just wants to make sure ok. Knots have been there about 8 weeks.  Nontender or painful or itching.  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     Review of Systems     Objective:   Physical Exam  Constitutional: She appears well-developed and well-nourished.  Musculoskeletal:  Fingers with normal range of motion. She does have 2 palpable cysts over the tendons in her palm for the fourth digit in her left hand. She also has 2 palpable cysts along the PIP joint on the pinky of her left hand. She has one large palpable cyst over one of the tendons in the palm of her right hand as well. Fingers with normal range of motion and strength. This is themselves are nontender. No surrounding rash or erythema.  Skin: Skin is warm and dry.  She does have 2 large incisions one over the dorsum of each hand where she has had skin cancers removed. 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 But it did require a large excision.  Psychiatric: She has a normal mood and affect. Her behavior is normal.          Assessment & Plan:  Synovial cyst on 5th digit. - Refer to hand surgeon for further evaluation. I suspect that these are just synovial cysts which are likely benign and unrelated to her prior history of skin cancer but I would like her to see the hand surgeon just for confirmation and to see if they would recommend just typical treatment or maybe even a biopsy. In this case I would rather be safe than sorry and would like her to see a specialist. Right now she's not having any pain or discomfort and it's not causing any limitation in range of motion.  Cysts on tendons in palm.

## 2012-10-27 ENCOUNTER — Other Ambulatory Visit: Payer: Self-pay | Admitting: *Deleted

## 2012-10-27 ENCOUNTER — Encounter: Payer: Self-pay | Admitting: Family Medicine

## 2012-10-27 ENCOUNTER — Ambulatory Visit (INDEPENDENT_AMBULATORY_CARE_PROVIDER_SITE_OTHER): Payer: Medicare Other | Admitting: Family Medicine

## 2012-10-27 VITALS — BP 132/78 | HR 61 | Ht 65.0 in | Wt 147.0 lb

## 2012-10-27 DIAGNOSIS — Z0000 Encounter for general adult medical examination without abnormal findings: Secondary | ICD-10-CM

## 2012-10-27 DIAGNOSIS — Z23 Encounter for immunization: Secondary | ICD-10-CM

## 2012-10-27 DIAGNOSIS — Z1231 Encounter for screening mammogram for malignant neoplasm of breast: Secondary | ICD-10-CM

## 2012-10-27 DIAGNOSIS — E785 Hyperlipidemia, unspecified: Secondary | ICD-10-CM

## 2012-10-27 DIAGNOSIS — M8580 Other specified disorders of bone density and structure, unspecified site: Secondary | ICD-10-CM

## 2012-10-27 DIAGNOSIS — R030 Elevated blood-pressure reading, without diagnosis of hypertension: Secondary | ICD-10-CM

## 2012-10-27 DIAGNOSIS — E559 Vitamin D deficiency, unspecified: Secondary | ICD-10-CM

## 2012-10-27 DIAGNOSIS — Z1211 Encounter for screening for malignant neoplasm of colon: Secondary | ICD-10-CM

## 2012-10-27 LAB — LIPID PANEL
HDL: 71 mg/dL (ref >39)
LDL Cholesterol: 94 mg/dL (ref 0–99)
Total CHOL/HDL Ratio: 2.7 Ratio
Triglycerides: 124 mg/dL (ref <150)

## 2012-10-27 LAB — COMPLETE METABOLIC PANEL WITH GFR
ALT: 17 U/L (ref 0–35)
Alkaline Phosphatase: 47 U/L (ref 39–117)
BUN: 22 mg/dL (ref 6–23)
CO2: 29 mEq/L (ref 19–32)
Chloride: 106 mEq/L (ref 96–112)
Creat: 0.98 mg/dL (ref 0.50–1.10)
Glucose, Bld: 86 mg/dL (ref 70–99)

## 2012-10-27 MED ORDER — RANITIDINE HCL 150 MG PO TABS: 150.0000 mg | ORAL_TABLET | Freq: Two times a day (BID) | ORAL | Status: DC

## 2012-10-27 MED ORDER — SIMVASTATIN 40 MG PO TABS: 40.0000 mg | ORAL_TABLET | Freq: Every day | ORAL | Status: DC

## 2012-10-27 NOTE — Patient Instructions (Addendum)
Remember to get a yearly eye exam.   You are due for colonoscopy.  We will call you with your lab results. If you don't here from [place] in about a week then please give [place] a call at [phone].

## 2012-10-27 NOTE — Progress Notes (Signed)
Subjective:    Sierra [name] is a 72 y.o. female who presents for Medicare Annual/Subsequent preventive examination.  Preventive Screening-Counseling & Management  Tobacco History  Smoking status  . Former Smoker  . Types: Cigarettes  . Quit date: 07/29/1980  Smokeless tobacco  . Not on file     Problems Prior to Visit 1.   Current Problems (verified) Patient Active Problem List  Diagnosis  . HYPERLIPIDEMIA  . OSTEOPENIA  . FATIGUE  . ELEVATED BLOOD PRESSURE WITHOUT DIAGNOSIS OF HYPERTENSION  . PAIN IN THORACIC SPINE  . UNSPECIFIED VITAMIN D DEFICIENCY  . Squamous cell carcinoma  . Basal cell cancer    Medications Prior to Visit Current Outpatient Prescriptions on File Prior to Visit  Medication Sig Dispense Refill  . Cholecalciferol (VITAMIN D) 1000 UNITS capsule Take 1 capsule (1,000 Units total) by mouth daily.  30 capsule  6   No current facility-administered medications on file prior to visit.    Current Medications (verified) Current Outpatient Prescriptions  Medication Sig Dispense Refill  . Cholecalciferol (VITAMIN D) 1000 UNITS capsule Take 1 capsule (1,000 Units total) by mouth daily.  30 capsule  6  . ranitidine (ZANTAC) 150 MG tablet Take 1 tablet (150 mg total) by mouth 2 (two) times daily.  180 tablet  1  . simvastatin (ZOCOR) 40 MG tablet Take 1 tablet (40 mg total) by mouth at bedtime.  90 tablet  2   No current facility-administered medications for this visit.     Allergies (verified) Statins   PAST HISTORY  Family History Family History  Problem Relation Age of Onset  . Heart disease Mother 48    chf  . Hyperlipidemia Father   . Diabetes Father     Social History History  Substance Use Topics  . Smoking status: Former Smoker    Types: Cigarettes    Quit date: 07/29/1980  . Smokeless tobacco: Not on file  . Alcohol Use: No     Are there smokers in your home (other than you)? No  Risk Factors Current exercise habits: 3  times per week walking  Dietary issues discussed: none   Cardiac risk factors: advanced age (older than 8 for men, 37 for women) and hypertension.  Depression Screen (Note: if answer to either of the following is "Yes", a more complete depression screening is indicated)   Over the past two weeks, have you felt down, depressed or hopeless? No  Over the past two weeks, have you felt little interest or pleasure in doing things? No  Have you lost interest or pleasure in daily life? No  Do you often feel hopeless? No  Do you cry easily over simple problems? No  Activities of Daily Living In your present state of health, do you have any difficulty performing the following activities?:  Driving? No Managing money?  No Feeding yourself? No Getting from bed to chair? No Climbing a flight of stairs? No Preparing food and eating?: No Bathing or showering? No Getting dressed: No Getting to the toilet? No Using the toilet:No Moving around from place to place: No In the past year have you fallen or had a near fall?:No   Are you sexually active?  No  Do you have more than one partner?  No  Hearing Difficulties: Yes Do you often ask people to speak up or repeat themselves? Yes Do you experience ringing or noises in your ears? Yes Do you have difficulty understanding soft or whispered voices? Yes  Do you feel that you have a problem with memory? No  Do you often misplace items? No  Do you feel safe at home?  Yes  Cognitive Testing  Alert? Yes  Normal Appearance?Yes  Oriented to person? Yes  Place? Yes   Time? Yes  Recall of three objects?  Yes  Can perform simple calculations? Yes  Displays appropriate judgment?Yes  Can read the correct time from a watch face?Yes   Advanced Directives have been discussed with the patient? Yes  List the Names of Other Physician/Practitioners you currently use: 1.    Indicate any recent Medical Services you may have received from other than [facility]  providers in the past year (date may be approximate).  Immunization History  Administered Date(s) Administered  . Pneumococcal Polysaccharide 09/18/2009    Screening Tests Health Maintenance  Topic Date Due  . Colonoscopy  07/29/2012  . Tetanus/tdap  07/29/2012  . Mammogram  09/04/2012  . Influenza Vaccine  05/04/2013  . Zostavax  10/27/2013  . Pneumococcal Polysaccharide Vaccine Age 12 And Over  Completed    All answers were reviewed with the patient and necessary referrals were made:  Sierra Nery, MD   10/27/2012   History reviewed: allergies, current medications, past family history, past medical history, past social history, past surgical history and problem list  Review of Systems A comprehensive review of systems was negative.    Objective:     Vision by Snellen chart: right eye:see vitals, left eye:see vitals  Body mass index is 24.46 kg/(m^2). BP 155/86  Pulse 61  Ht 5\' 5"  (1.651 m)  Wt 147 lb (66.679 kg)  BMI 24.46 kg/m2  BP 155/86  Pulse 61  Ht 5\' 5"  (1.651 m)  Wt 147 lb (66.679 kg)  BMI 24.46 kg/m2  General Appearance:    Alert, cooperative, no distress, appears stated age  Head:    Normocephalic, without obvious abnormality, atraumatic  Eyes:    PERRL, conjunctiva/corneas clear, EOM's intact, both eyes  Ears:    Normal TM's and external ear canals, both ears  Nose:   Nares normal, septum midline, mucosa normal, no drainage    or sinus tenderness  Throat:   Lips, mucosa, and tongue normal; teeth and gums normal  Neck:   Supple, symmetrical, trachea midline, no adenopathy;    thyroid:  no enlargement/tenderness/nodules; no carotid   bruit or JVD  Back:     Symmetric, no curvature, ROM normal, no CVA tenderness  Lungs:     Clear to auscultation bilaterally, respirations unlabored  Chest Wall:    No tenderness or deformity   Heart:    Regular rate and rhythm, S1 and S2 normal, no murmur, rub   or gallop  Breast Exam:    Not performed.   Abdomen:      Soft, non-tender, bowel sounds active all four quadrants,    no masses, no organomegaly  Genitalia:    Not perfromed.   Rectal:    Not performed.   Extremities:   Extremities normal, atraumatic, no cyanosis or edema  Pulses:   2+ and symmetric all extremities  Skin:   Skin color, texture, turgor normal, no rashes or lesions  Lymph nodes:   Cervical, supraclavicular  nodes normal  Neurologic:   CNII-XII intact, normal strength, sensation and reflexes    throughout       Assessment:     Annual Wellness Exam     Plan:     During the course of the visit the  patient was educated and counseled about appropriate screening and preventive services including:    Td vaccine  Screening mammography  Bone densitometry screening  Colorectal cancer screening  Diet review for nutrition referral? Yes ____  Not Indicated _X_   Patient Instructions (the written plan) was given to the patient.  Medicare Attestation I have personally reviewed: The patient's medical and social history Their use of alcohol, tobacco or illicit drugs Their current medications and supplements The patient's functional ability including ADLs,fall risks, home safety risks, cognitive, and hearing and visual impairment Diet and physical activities Evidence for depression or mood disorders  The patient's weight, height, BMI, and visual acuity have been recorded in the chart.  I have made referrals, counseling, and provided education to the patient based on review of the above and I have provided the patient with a written personalized care plan for preventive services.     Artha Chiasson, MD   10/27/2012

## 2012-10-28 LAB — VITAMIN D 25 HYDROXY (VIT D DEFICIENCY, FRACTURES): Vit D, 25-Hydroxy: 41 ng/mL (ref 30–89)

## 2012-11-06 ENCOUNTER — Ambulatory Visit: Payer: Medicare Other | Admitting: Sports Medicine

## 2012-11-11 ENCOUNTER — Telehealth: Payer: Self-pay | Admitting: Family Medicine

## 2012-11-11 NOTE — Telephone Encounter (Signed)
Call patient: Bone density test shows that her bone density is stable from 2007 which is fantastic. She is still in the osteopenia range dose to make sure continuing to take calcium with vitamin D and get regular exercise. Repeat bone density in 2 years.

## 2012-11-11 NOTE — Telephone Encounter (Signed)
Pt notified of results and instructions. Kimberly Gordon, LPN  

## 2012-11-24 ENCOUNTER — Encounter: Payer: Self-pay | Admitting: Family Medicine

## 2012-12-08 ENCOUNTER — Encounter: Payer: Self-pay | Admitting: Family Medicine

## 2013-02-02 ENCOUNTER — Other Ambulatory Visit: Payer: Self-pay | Admitting: *Deleted

## 2013-02-02 MED ORDER — SIMVASTATIN 40 MG PO TABS: 40.0000 mg | ORAL_TABLET | Freq: Every day | ORAL | Status: DC

## 2013-02-02 MED ORDER — RANITIDINE HCL 150 MG PO TABS: 150.0000 mg | ORAL_TABLET | Freq: Two times a day (BID) | ORAL | Status: DC

## 2013-03-01 ENCOUNTER — Ambulatory Visit (INDEPENDENT_AMBULATORY_CARE_PROVIDER_SITE_OTHER): Payer: Self-pay | Admitting: Family Medicine

## 2013-03-01 ENCOUNTER — Encounter: Payer: Self-pay | Admitting: Family Medicine

## 2013-03-01 DIAGNOSIS — D239 Other benign neoplasm of skin, unspecified: Secondary | ICD-10-CM

## 2013-03-01 DIAGNOSIS — D229 Melanocytic nevi, unspecified: Secondary | ICD-10-CM

## 2013-03-01 DIAGNOSIS — L821 Other seborrheic keratosis: Secondary | ICD-10-CM

## 2013-03-01 NOTE — Progress Notes (Signed)
  Subjective:    Patient ID: Sierra [name], female    DOB: 11-25-1940, 72 y.o.   MRN: [id number]  HPI You're to have a shave biopsy of lesion on the right forearm. She does have a history of squamous cell and basal cell skin cancers. She thinks this is similar. She noticed the area about a month ago. She said initially it was scaling a little bit more sleep is still red. She has another lesion on her right calf that she would like me to look at today. She noticed that it had what she felt like was a core. She also has a hair follicle on her chin that tends to get recurrent ingrown hairs would like to have them frozen today as well. She said she did squeeze her out of it over the weekend which is why it looks irritated and red today.   Review of Systems     Objective:   Physical Exam Lesion on right forearm is approximately 0.5 x 1 cm in size. It has a crescent shape to it is mildly erythematous with a shiny appearance. Overall it's fairly macular. She does have a seborrheic keratosis with a little bit of keratinization at the Center on the right calf muscle. She also has a small erythematous papule under her chin that looks irritated.       Assessment & Plan:  Shave biopsy performed a lesion on her right forearm. Patient tolerated well. We will call her with her biopsy results once available. She does have a history of basal cell and squamous cell carcinoma.  Seborrheic keratosis on the right calf-cryotherapy performed today.    Shave Biopsy Procedure Note  Pre-operative Diagnosis: Suspicious lesion  Post-operative Diagnosis: same  Locations:right forearm, anterior side  Indications: hx of cancer, suspicous  Anesthesia: Lidocaine 1% with epinephrine without added sodium bicarbonate  Procedure Details  History of allergy to iodine: no  Patient informed of the risks (including bleeding and infection) and benefits of the  procedure and Verbal informed consent obtained.  The lesion  and surrounding area were given a sterile prep using betadyne and draped in the usual sterile fashion. A scalpel was used to shave an area of skin approximately 1cm by 0.5cm.  Hemostasis achieved with alumuninum chloride. Antibiotic ointment and a sterile dressing applied.  The specimen was sent for pathologic examination. The patient tolerated the procedure well.  EBL: 0 ml  Findings: Await pathology  Condition: Stable  Complications: none.  Plan: 1. Instructed to keep the wound dry and covered for 24-48h and clean thereafter. 2. Warning signs of infection were reviewed.   3. Recommended that the patient use OTC acetaminophen as needed for pain.  4. Return as neede.

## 2013-03-01 NOTE — Patient Instructions (Signed)
We will call with the biopsy results in about a week.  

## 2013-10-06 ENCOUNTER — Ambulatory Visit (INDEPENDENT_AMBULATORY_CARE_PROVIDER_SITE_OTHER): Payer: Commercial Managed Care - HMO | Admitting: Physician Assistant

## 2013-10-06 ENCOUNTER — Encounter: Payer: Self-pay | Admitting: Physician Assistant

## 2013-10-06 VITALS — BP 170/94 | HR 68 | Temp 97.8°F | Wt 157.0 lb

## 2013-10-06 DIAGNOSIS — N390 Urinary tract infection, site not specified: Secondary | ICD-10-CM

## 2013-10-06 DIAGNOSIS — R300 Dysuria: Secondary | ICD-10-CM

## 2013-10-06 DIAGNOSIS — I10 Essential (primary) hypertension: Secondary | ICD-10-CM

## 2013-10-06 DIAGNOSIS — IMO0001 Reserved for inherently not codable concepts without codable children: Secondary | ICD-10-CM

## 2013-10-06 DIAGNOSIS — K219 Gastro-esophageal reflux disease without esophagitis: Secondary | ICD-10-CM

## 2013-10-06 LAB — POCT URINALYSIS DIPSTICK
Bilirubin, UA: NEGATIVE
GLUCOSE UA: NEGATIVE
Ketones, UA: NEGATIVE
Nitrite, UA: NEGATIVE
Protein, UA: NEGATIVE
SPEC GRAV UA: 1.01
UROBILINOGEN UA: 0.2
pH, UA: 6.5

## 2013-10-06 MED ORDER — RANITIDINE HCL 150 MG PO TABS: 150.0000 mg | ORAL_TABLET | Freq: Two times a day (BID) | ORAL | Status: DC

## 2013-10-06 MED ORDER — LOSARTAN POTASSIUM-HCTZ 50-12.5 MG PO TABS: 1.0000 | ORAL_TABLET | Freq: Every day | ORAL | Status: DC

## 2013-10-06 MED ORDER — NITROFURANTOIN MONOHYD MACRO 100 MG PO CAPS: 100.0000 mg | ORAL_CAPSULE | Freq: Two times a day (BID) | ORAL | Status: DC

## 2013-10-06 NOTE — Patient Instructions (Signed)

## 2013-10-08 DIAGNOSIS — K219 Gastro-esophageal reflux disease without esophagitis: Secondary | ICD-10-CM

## 2013-10-08 DIAGNOSIS — IMO0001 Reserved for inherently not codable concepts without codable children: Secondary | ICD-10-CM | POA: Insufficient documentation

## 2013-10-08 DIAGNOSIS — I10 Essential (primary) hypertension: Secondary | ICD-10-CM | POA: Insufficient documentation

## 2013-10-08 NOTE — Progress Notes (Signed)
   Subjective:    Patient ID: Sierra [name], female    DOB: 07-06-1941, 73 y.o.   MRN: [id number]  HPI Patient is a 73 year old female who presents to the clinic with dysuria and abnormal urine odor for the last week. Symptoms have progressively been getting worse. She denies any fever, chills, nausea or vomiting. She has started to have some occasional low back pain. She has not tried anything to make better.  Patient's blood pressure is elevated today. Looking back it seems like she has had some problems with blood pressure elevation in the past. She denies any palpitations, chest pain, headache or vision changes.  GERD-patient is on Zantac daily with symptoms controlled. She needs refill.   Review of Systems     Objective:   Physical Exam  Constitutional: She is oriented to person, place, and time. She appears well-developed and well-nourished.  HENT:  Head: Normocephalic and atraumatic.  Cardiovascular: Normal rate, regular rhythm and normal heart sounds.   Pulmonary/Chest: Effort normal and breath sounds normal.  Negative for CVA tenderness.  Abdominal: Soft. Bowel sounds are normal. She exhibits no distension and no mass. There is no tenderness.  Neurological: She is alert and oriented to person, place, and time.  Skin: Skin is dry.  Psychiatric: She has a normal mood and affect. Her behavior is normal.          Assessment & Plan:  UTI-UA positive for leuks and blood. Treated with Macrobid. Gave handout with symptomatic treatment. Call if not improving.  Hypertension-at this point looking back at history she has had ongoing elevated blood pressure. I suggest starting medication at this point. We started Hyzaar. Patient had a concern about the ACE cough therefore we decided to avoid that all together. Discussed other metabolic side effects of Hyzaar. Discuss with patient goal blood pressure is under 150/90. Patient is to follow up with Dr. Charise Carwin in one month.   GERD-zantac  was refilled.

## 2013-10-23 ENCOUNTER — Encounter: Payer: Self-pay | Admitting: Family Medicine

## 2013-11-01 ENCOUNTER — Ambulatory Visit (INDEPENDENT_AMBULATORY_CARE_PROVIDER_SITE_OTHER): Payer: Commercial Managed Care - HMO | Admitting: Physician Assistant

## 2013-11-01 ENCOUNTER — Encounter: Payer: Self-pay | Admitting: Physician Assistant

## 2013-11-01 VITALS — BP 140/91 | HR 78

## 2013-11-01 DIAGNOSIS — R600 Localized edema: Secondary | ICD-10-CM

## 2013-11-01 DIAGNOSIS — R21 Rash and other nonspecific skin eruption: Secondary | ICD-10-CM

## 2013-11-01 DIAGNOSIS — R609 Edema, unspecified: Secondary | ICD-10-CM

## 2013-11-01 DIAGNOSIS — I10 Essential (primary) hypertension: Secondary | ICD-10-CM

## 2013-11-01 NOTE — Patient Instructions (Signed)
Stop Hyzaar.

## 2013-11-02 ENCOUNTER — Other Ambulatory Visit: Payer: Self-pay | Admitting: Physician Assistant

## 2013-11-02 LAB — CBC WITH DIFFERENTIAL/PLATELET
BASOS PCT: 0 % (ref 0–1)
Basophils Absolute: 0 10*3/uL (ref 0.0–0.1)
EOS PCT: 2 % (ref 0–5)
Eosinophils Absolute: 0.2 10*3/uL (ref 0.0–0.7)
HEMATOCRIT: 43.1 % (ref 36.0–46.0)
HEMOGLOBIN: 14.4 g/dL (ref 12.0–15.0)
LYMPHS ABS: 2.9 10*3/uL (ref 0.7–4.0)
Lymphocytes Relative: 31 % (ref 12–46)
MCH: 30.3 pg (ref 26.0–34.0)
MCHC: 33.4 g/dL (ref 30.0–36.0)
MCV: 90.5 fL (ref 78.0–100.0)
MONO ABS: 0.7 10*3/uL (ref 0.1–1.0)
Monocytes Relative: 8 % (ref 3–12)
Neutro Abs: 5.4 10*3/uL (ref 1.7–7.7)
Neutrophils Relative %: 59 % (ref 43–77)
Platelets: 356 10*3/uL (ref 150–400)
RBC: 4.76 MIL/uL (ref 3.87–5.11)
RDW: 13.6 % (ref 11.5–15.5)
WBC: 9.2 10*3/uL (ref 4.0–10.5)

## 2013-11-02 LAB — BASIC METABOLIC PANEL WITH GFR
BUN: 14 mg/dL (ref 6–23)
CO2: 28 meq/L (ref 19–32)
Calcium: 10 mg/dL (ref 8.4–10.5)
Chloride: 104 mEq/L (ref 96–112)
Creat: 0.71 mg/dL (ref 0.50–1.10)
GFR, Est Non African American: 85 mL/min
Glucose, Bld: 87 mg/dL (ref 70–99)
POTASSIUM: 4.5 meq/L (ref 3.5–5.3)
SODIUM: 142 meq/L (ref 135–145)

## 2013-11-02 MED ORDER — HYDROCHLOROTHIAZIDE 12.5 MG PO TABS: 25.0000 mg | ORAL_TABLET | Freq: Every day | ORAL | Status: DC

## 2013-11-02 NOTE — Progress Notes (Signed)
   Subjective:    Patient ID: Sierra [name], female    DOB: 04-Oct-1940, 73 y.o.   MRN: [id number]  HPI Pt is a 73 yo female who presents to the clinic to follow up on HTN. She was put on hyzaar 50/12.5mg  daily about 3 weeks ago. Pt comes in because every since starting the medication she has had on and off again bilateral feet and ankle swelling and an unusual rash on both anterior legs. Rash does not itch or hurt. She has used lotion with no resolvement. Nancy Fetter seems to make worse.  She denies any other side effects. She reports that before medication never had any problems with swelling of legs/ankle/feet or rash. Edema seems better today but sometimes can get so bad hurts to walk and cannot wear shoes.    Review of Systems     Objective:   Physical Exam  Constitutional: She is oriented to person, place, and time. She appears well-developed and well-nourished.  HENT:  Head: Normocephalic and atraumatic.  Cardiovascular: Normal rate, regular rhythm and normal heart sounds.   Pulmonary/Chest: Effort normal and breath sounds normal. She has no wheezes.  Neurological: She is alert and oriented to person, place, and time.  Skin:  Minimal Non-pitting edema around lateral malleolus of both ankles.   Pinpoint petechial rash on anterior legs bilaterally up to knee.   Psychiatric: She has a normal mood and affect. Her behavior is normal.          Assessment & Plan:  Bilateral feet/ankle edema/peticheal rash/HTN- BP is well controlled for pt's age on this medication however new onset of swelling and rash are suggestive of medication reaction. Discussed with pt HCTZ usually decreases swelling not increases. Per pt she has had several medication do the opposite of what they were designed to do. I would like for her to stop medication. I would also like to check kidney function to make sure everything is being metabolized as should be. Will call after labs to give next instruction. Keep appt with Dr.  Madilyn Fireman on 27th of April.

## 2013-11-22 ENCOUNTER — Encounter: Payer: Self-pay | Admitting: Family Medicine

## 2013-11-22 ENCOUNTER — Ambulatory Visit (INDEPENDENT_AMBULATORY_CARE_PROVIDER_SITE_OTHER): Payer: Commercial Managed Care - HMO | Admitting: Family Medicine

## 2013-11-22 VITALS — BP 140/84 | HR 65 | Wt 158.0 lb

## 2013-11-22 DIAGNOSIS — E785 Hyperlipidemia, unspecified: Secondary | ICD-10-CM

## 2013-11-22 DIAGNOSIS — I10 Essential (primary) hypertension: Secondary | ICD-10-CM

## 2013-11-22 MED ORDER — HYDROCHLOROTHIAZIDE 25 MG PO TABS: 25.0000 mg | ORAL_TABLET | Freq: Every day | ORAL | Status: DC

## 2013-11-22 MED ORDER — METOPROLOL SUCCINATE ER 25 MG PO TB24: 25.0000 mg | ORAL_TABLET | Freq: Every day | ORAL | Status: DC

## 2013-11-22 NOTE — Patient Instructions (Signed)
If after 2 weeks on the metoprolol can increase to 2 tabs if needed and if blood pressures are above 150 on the top or above 90 on the bottom.

## 2013-11-22 NOTE — Progress Notes (Signed)
   Subjective:    Patient ID: Sierra [name], female    DOB: 07/27/1941, 73 y.o.   MRN: [id number]  HPI HTN- Home BPs in the 160s at home.DBP in the 80s. Tolerating it well.  Stopped hte losartan and the drug rash is almost clear.  No CP, SOB.  She has been a little more lightheaded.   Hyperlipidemia-she was unable to tolerate the statin. She's been off of it for quite some time. She is now vegetarian and has been for about the last 3 months and wants to recheck her levels. Her last lipid level was a year ago and was well-controlled, but she was on medication at that time. Review of Systems     Objective:   Physical Exam  Constitutional: She is oriented to person, place, and time. She appears well-developed and well-nourished.  HENT:  Head: Normocephalic and atraumatic.  Cardiovascular: Normal rate, regular rhythm and normal heart sounds.   Pulmonary/Chest: Effort normal and breath sounds normal.  Neurological: She is alert and oriented to person, place, and time.  Skin: Skin is warm and dry.  Psychiatric: She has a normal mood and affect. Her behavior is normal.          Assessment & Plan:  HTN - No maximally controlled.  Home blood pressures are still out of range. We'll add low-dose metoprolol to her regimen. Warn about decrease in pulse with this medication. Also warned about lightheadedness and dizziness especially when she first starts the medication. Follow up in one month to make sure tolerating it well. Electrolytes were normal previously after being on the Dyazide.  Hyperlipidemia - Due to recheck. She is off a statin. She said she became vegetarian about 3 months ago is helping her closer look a lot better.

## 2013-12-23 ENCOUNTER — Ambulatory Visit: Payer: Commercial Managed Care - HMO | Admitting: Family Medicine

## 2013-12-27 ENCOUNTER — Encounter: Payer: Self-pay | Admitting: Family Medicine

## 2013-12-27 ENCOUNTER — Ambulatory Visit (INDEPENDENT_AMBULATORY_CARE_PROVIDER_SITE_OTHER): Payer: Commercial Managed Care - HMO | Admitting: Family Medicine

## 2013-12-27 VITALS — BP 134/86 | HR 65 | Wt 155.0 lb

## 2013-12-27 DIAGNOSIS — I10 Essential (primary) hypertension: Secondary | ICD-10-CM

## 2013-12-27 DIAGNOSIS — K921 Melena: Secondary | ICD-10-CM

## 2013-12-27 DIAGNOSIS — G5620 Lesion of ulnar nerve, unspecified upper limb: Secondary | ICD-10-CM

## 2013-12-27 MED ORDER — PREDNISONE 50 MG PO TABS: 50.0000 mg | ORAL_TABLET | Freq: Every day | ORAL | Status: DC

## 2013-12-27 MED ORDER — HYDROCHLOROTHIAZIDE 25 MG PO TABS: 25.0000 mg | ORAL_TABLET | Freq: Every day | ORAL | Status: DC

## 2013-12-27 NOTE — Progress Notes (Addendum)
Subjective:    Patient ID: Sierra [name], female    DOB: 24-Nov-1940, 73 y.o.   MRN: [id number]  HPI Hypertension- Pt denies chest pain, SOB, dizziness, or heart palpitations.  Taking meds as directed w/o problems.  Denies medication side effects.  Says the metoprolol caused a rash and diarrhea.  Took it for 2 weeks.  Finally stopped it 3 days ago. + nausea. Felt dizzy with the diarrhea. Di have some blood in the stools. Had a lot of mucous with the loose stools.   Left hand with the 4th and 5th fingers goind numb.  Feels like it is persistant.  Feel like travels from teh wrist.  Not worse at night. Denies any trauma or injury. No worsening or alleviating factors. She has not tried taking any medication. She has not had any redness or swelling or weakness in the hand itself. She denies any neck, elbow, or wrist pain.  Review of Systems     BP 134/86  Pulse 65  Wt 155 lb (70.308 kg)    Allergies  Allergen Reactions  . Bisphosphonates Other (See Comments)    Jaw necrosis, GERD  . Statins     Flu like symptoms, myalgias  . Losartan Rash  . Metoprolol Diarrhea and Rash    Past Medical History  Diagnosis Date  . 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 GERD (gastroesophageal reflux disease)   . Hypertension   . Hyperlipidemia   . Vitamin D deficiency     Past Surgical History  Procedure Laterality Date  . Tonsillectomy      History   Social History  . Marital Status: Married    Spouse Name: N/A    Number of Children: N/A  . Years of Education: N/A   Occupational History  . Not on file.   Social History Main Topics  . Smoking status: Former Smoker    Types: Cigarettes    Quit date: 07/29/1980  . Smokeless tobacco: Not on file  . Alcohol Use: No  . Drug Use: No  . Sexual Activity: Not on file   Other Topics Concern  . Not on file   Social History Narrative  . No narrative on file    Family History  Problem Relation Age of Onset  . Heart  disease Mother 44    chf  . Hyperlipidemia Father   . Diabetes Father     Outpatient Encounter Prescriptions as of 12/27/2013  Medication Sig  . Cholecalciferol (VITAMIN D) 1000 UNITS capsule Take 1 capsule (1,000 Units total) by mouth daily.  . hydrochlorothiazide (HYDRODIURIL) 25 MG tablet Take 1 tablet (25 mg total) by mouth daily.  . ranitidine (ZANTAC) 150 MG tablet Take 1 tablet (150 mg total) by mouth 2 (two) times daily.  . [DISCONTINUED] hydrochlorothiazide (HYDRODIURIL) 25 MG tablet Take 1 tablet (25 mg total) by mouth daily.  . predniSONE (DELTASONE) 50 MG tablet Take 1 tablet (50 mg total) by mouth daily.  . [DISCONTINUED] metoprolol succinate (TOPROL-XL) 25 MG 24 hr tablet Take 1-2 tablets (25-50 mg total) by mouth daily.       Objective:   Physical Exam  Constitutional: She is oriented to person, place, and time. She appears well-developed and well-nourished.  HENT:  Head: Normocephalic and atraumatic.  Cardiovascular: Normal rate, regular rhythm and normal heart sounds.   Pulmonary/Chest: Effort normal and breath sounds normal.  Musculoskeletal:  Neck with normal range of motion. Elbows shoulders and wrists with  normal range of motion. 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 Negative Tinel's and Phalen's sign. Nontender over the elbow joint.  Neurological: She is alert and oriented to person, place, and time.  Skin: Skin is warm and dry.  Psychiatric: She has a normal mood and affect. Her behavior is normal.          Assessment & Plan:  HTN - well controlled on current regimen. Add metoprolol to her allergy list said she did get a rash and diarrhea. She is feeling little bit better today but still having some loose stools.  Ulnar neuropathy - handout provided for stretches to do her own home. We'll also get a five-day course of prednisone to see if she is improving. Followup in one month. If not resolved  at that time then consider nerve conduction study for further evaluation.  Diarrhea - sounds like he was secondary to the metoprolol that she did have some bloody stools. She does have known internal hemorrhoids that were picked up on colonoscopy which certainly could be a bleeding source. Which is to be on the safe side recommend stool cultures.

## 2013-12-27 NOTE — Patient Instructions (Signed)
Ulnar Nerve Contusion with Rehab The ulnar nerve lies near the surface of the skin as it passes by the elbow. This location causes it to be susceptible to injury. An ulnar nerve contusion is a bruise of the nerve. It is the result of direct trauma to the elbow. Ulnar nerve contusions are characterized by pain, weakness, and loss of feeling in the hand. SYMPTOMS   Signs of nerve damage include: tingling, numbness, weakness, and/or loss of feeling in the hand, specifically the little finger and ring finger.  Sharp pains that may shoot from the elbow to the wrist and hand.  Decreased hand function.  Tenderness and/ or inflammation in the elbow.  Muscle wasting (atrophy) in the hand. CAUSES  Ulnar nerve contusions are caused by direct trauma to the elbow that results in bleeding which enters the nerve. RISK INCREASES WITH:  Contact sports (football, soccer, or rugby).  Bleeding disorders.  Taking blood thinning medicine (warfarin [Coumadin], aspirin, or nonsteroidal anti-inflammatory medications).  Diabetes mellitus.  Underactive thyroid gland (hypothyroidism). PREVENTION  Wear properly fitted and padded protective equipment.  Only take blood thinning medication when necessary. PROGNOSIS  Ulnar nerve contusions usually heal within 6 weeks. Healing often occurs spontaneously, but treatment helps reduce symptoms.  RELATED COMPLICATIONS   Permanent nerve damage, including pain, numbness, tingling, or weakness in the hand (rare).  Weak grip.  Prolonged healing time, if improperly treated or re-injured. TREATMENT  Treatment initially involves resting from any activities that aggravate the symptoms, and the use of ice and medications to help reduce pain and inflammation. The use of strengthening and stretching exercises may help reduce pain with activity. These exercises may be performed at home or with referral to a therapist. Your caregiver may recommend that you splint the elbow at  night to help healing of the nerve. If symptoms persist despite conservative (non-surgical) treatment, then surgery may be recommended to free the nerve. MEDICATION   If pain medication is necessary, then nonsteroidal anti-inflammatory medications, such as aspirin and ibuprofen, or other minor pain relievers, such as acetaminophen, are often recommended.  Do not take pain medication within 7 days before surgery.  Prescription pain relievers may be given if deemed necessary by your caregiver. Use only as directed and only as much as you need. COLD THERAPY  Cold treatment (icing) relieves pain and reduces inflammation. Cold treatment should be applied for 10 to 15 minutes every 2 to 3 hours for inflammation and pain and immediately after any activity that aggravates your symptoms. Use ice packs or massage the area with a piece of ice (ice massage). SEEK MEDICAL CARE IF:   Treatment seems to offer no benefit, or the condition worsens.  Any medications produce adverse side effects. EXERCISES RANGE OF MOTION (ROM) AND STRETCHING EXERCISES - Ulnar Nerve Contusion These exercises may help you when beginning to rehabilitate your injury. Do not begin these exercises until your physician, physical therapist or athletic trainer advises you to do so. Discontinue any exercise that worsens your symptoms. Contact your physician with instructions on how to continue. Your symptoms may resolve with or without further involvement from your physician, physical therapist or athletic trainer. While completing these exercises, remember:  Restoring tissue flexibility helps normal motion to return to the joints. This allows healthier, less painful movement and activity.  An effective stretch should be held for at least 30 seconds.  A stretch should never be painful. You should only feel a gentle lengthening or release in the stretched tissue. RANGE  OF MOTION  Extension  Hold your right / left arm at your side and  straighten your elbow as far as you can using your right / left arm muscles.  Straighten the right / left elbow farther by gently pushing down on your forearm until you feel a gentle stretch on the inside of your elbow. Hold this position for __________ seconds.  Slowly return to the starting position. Repeat __________ times. Complete this exercise __________ times per day.  RANGE OF MOTION  Flexion  Hold your right / left arm at your side and bend your elbow as far as you can using your right / left arm muscles.  Bend the right / left elbow farther by gently pushing up on your forearm until you feel a gentle stretch on the outside of your elbow. Hold this position for __________ seconds.  Slowly return to the starting position. Repeat __________ times. Complete this exercise __________ times per day.  RANGE OF MOTION  Wrist Flexion, Active-Assisted  Extend your right / left elbow with your fingers pointing down.*  Gently pull the back of your hand towards you until you feel a gentle stretch on the top of your forearm.  Hold this position for __________ seconds. Repeat __________ times. Complete this exercise __________ times per day.  *If directed by your physician, physical therapist or athletic trainer, complete this stretch with your elbow bent rather than extended. RANGE OF MOTION  Wrist Extension, Active-Assisted  Extend your right / left elbow and turn your palm upwards.*  Gently pull your palm/fingertips back so your wrist extends and your fingers point more toward the ground.  You should feel a gentle stretch on the inside of your forearm.  Hold this position for __________ seconds. Repeat __________ times. Complete this exercise __________ times per day. *If directed by your physician, physical therapist or athletic trainer, complete this stretch with your elbow bent, rather than extended. RANGE OF MOTION  Supination, Active  Stand or sit with your elbows at your side.  Bend your right / left elbow to 90 degrees.  Turn your palm upward until you feel a gentle stretch on the inside of your forearm.  Hold this position for __________ seconds. Slowly release and return to the starting position. Repeat __________ times. Complete this stretch __________ times per day.  RANGE OF MOTION  Pronation, Active  Stand or sit with your elbows at your side. Bend your right / left elbow to 90 degrees.  Turn your palm downward until you feel a gentle stretch on the top of your forearm.  Hold this position for __________ seconds. Slowly release and return to the starting position. Repeat __________ times. Complete this stretch __________ times per day.  STRETCH - Wrist Flexion   Place the back of your right / left hand on a tabletop leaving your elbow slightly bent. Your fingers should point away from your body.  Gently press the back of your hand down onto the table by straightening your elbow. You should feel a stretch on the top of your forearm.  Hold this position for __________ seconds. Repeat __________ times. Complete this stretch __________ times per day.  STRETCH  Wrist Extension   Place your right / left fingertips on a tabletop leaving your elbow slightly bent. Your fingers should point backwards.  Gently press your fingers and palm down onto the table by straightening your elbow. You should feel a stretch on the inside of your forearm.  Hold this position for __________  seconds. Repeat __________ times. Complete this stretch __________ times per day.  STRENGTHENING EXERCISES - Ulnar Nerve Contusion These exercises may help you when beginning to rehabilitate your injury. Do not begin these exercises until your physician, physical therapist or athletic trainer advises you to do so. Discontinue any exercise that worsens your symptoms. Contact your physician for instructions on how to continue. They may resolve your symptoms with or without further involvement  from your physician, physical therapist or athletic trainer. While completing these exercises, remember:   Muscles can gain both the endurance and the strength needed for everyday activities through controlled exercises.  Complete these exercises as instructed by your physician, physical therapist or athletic trainer. Progress with the resistance and repetition exercises only as your caregiver advises. STRENGTH Wrist Flexors  Sit with your right / left forearm palm-up and fully supported. Your elbow should be resting below the height of your shoulder. Allow your wrist to extend over the edge of the surface.  Loosely holding a __________ weight or a piece of rubber exercise band/tubing, slowly curl your hand up toward your forearm.  Hold this position for __________ seconds. Slowly lower the wrist back to the starting position in a controlled manner. Repeat __________ times. Complete this exercise __________ times per day.  STRENGTH  Wrist Extensors  Sit with your right / left forearm palm-down and fully supported. Your elbow should be resting below the height of your shoulder. Allow your wrist to extend over the edge of the surface.  Loosely holding a __________ weight or a piece of rubber exercise band/tubing, slowly curl your hand up toward your forearm.  Hold this position for __________ seconds. Slowly lower the wrist back to the starting position in a controlled manner. Repeat __________ times. Complete this exercise __________ times per day.  STRENGTH - Ulnar Deviators  Stand with a ____________________ weight in your right / left hand or sit holding on to the rubber exercise band/tubing with your opposite arm supported.  Move your wrist so that your pinkie travels toward your forearm and your thumb moves away from your forearm.  Hold this position for __________ seconds and then slowly lower the wrist back to the starting position. Repeat __________ times. Complete this exercise  __________ times per day STRENGTH - Radial Deviators  Stand with a ____________________ weight in your right / left hand or sit holding on to the rubber exercise band/tubing with your arm supported.  Raise your hand upward in front of you or pull up on the rubber tubing.  Hold this position for __________ seconds and then slowly lower the wrist back to the starting position. Repeat __________ times. Complete this exercise __________ times per day. STRENGTH - Grip  Grasp a tennis ball, a dense sponge, or a large, rolled sock in your hand.  Squeeze as hard as you can without increasing any pain.  Hold this position for __________ seconds. Release your grip slowly. Repeat __________ times. Complete this exercise __________ times per day.  Document Released: 07/15/2005 Document Revised: 10/07/2011 Document Reviewed: 10/27/2008 [facility] Patient Information 2014 [place], [place]. Ulnar Nerve Contusion with Rehab The ulnar nerve lies near the surface of the skin as it passes by the elbow. This location causes it to be susceptible to injury. An ulnar nerve contusion is a bruise of the nerve. It is the result of direct trauma to the elbow. Ulnar nerve contusions are characterized by pain, weakness, and loss of feeling in the hand. SYMPTOMS   Signs of nerve  damage include: tingling, numbness, weakness, and/or loss of feeling in the hand, specifically the little finger and ring finger.  Sharp pains that may shoot from the elbow to the wrist and hand.  Decreased hand function.  Tenderness and/ or inflammation in the elbow.  Muscle wasting (atrophy) in the hand. CAUSES  Ulnar nerve contusions are caused by direct trauma to the elbow that results in bleeding which enters the nerve. RISK INCREASES WITH:  Contact sports (football, soccer, or rugby).  Bleeding disorders.  Taking blood thinning medicine (warfarin [Coumadin], aspirin, or nonsteroidal anti-inflammatory medications).  Diabetes  mellitus.  Underactive thyroid gland (hypothyroidism). PREVENTION  Wear properly fitted and padded protective equipment.  Only take blood thinning medication when necessary. PROGNOSIS  Ulnar nerve contusions usually heal within 6 weeks. Healing often occurs spontaneously, but treatment helps reduce symptoms.  RELATED COMPLICATIONS   Permanent nerve damage, including pain, numbness, tingling, or weakness in the hand (rare).  Weak grip.  Prolonged healing time, if improperly treated or re-injured. TREATMENT  Treatment initially involves resting from any activities that aggravate the symptoms, and the use of ice and medications to help reduce pain and inflammation. The use of strengthening and stretching exercises may help reduce pain with activity. These exercises may be performed at home or with referral to a therapist. Your caregiver may recommend that you splint the elbow at night to help healing of the nerve. If symptoms persist despite conservative (non-surgical) treatment, then surgery may be recommended to free the nerve. MEDICATION   If pain medication is necessary, then nonsteroidal anti-inflammatory medications, such as aspirin and ibuprofen, or other minor pain relievers, such as acetaminophen, are often recommended.  Do not take pain medication within 7 days before surgery.  Prescription pain relievers may be given if deemed necessary by your caregiver. Use only as directed and only as much as you need. COLD THERAPY  Cold treatment (icing) relieves pain and reduces inflammation. Cold treatment should be applied for 10 to 15 minutes every 2 to 3 hours for inflammation and pain and immediately after any activity that aggravates your symptoms. Use ice packs or massage the area with a piece of ice (ice massage). SEEK MEDICAL CARE IF:   Treatment seems to offer no benefit, or the condition worsens.  Any medications produce adverse side effects. EXERCISES RANGE OF MOTION (ROM)  AND STRETCHING EXERCISES - Ulnar Nerve Contusion These exercises may help you when beginning to rehabilitate your injury. Do not begin these exercises until your physician, physical therapist or athletic trainer advises you to do so. Discontinue any exercise that worsens your symptoms. Contact your physician with instructions on how to continue. Your symptoms may resolve with or without further involvement from your physician, physical therapist or athletic trainer. While completing these exercises, remember:  Restoring tissue flexibility helps normal motion to return to the joints. This allows healthier, less painful movement and activity.  An effective stretch should be held for at least 30 seconds.  A stretch should never be painful. You should only feel a gentle lengthening or release in the stretched tissue. RANGE OF MOTION  Extension  Hold your right / left arm at your side and straighten your elbow as far as you can using your right / left arm muscles.  Straighten the right / left elbow farther by gently pushing down on your forearm until you feel a gentle stretch on the inside of your elbow. Hold this position for __________ seconds.  Slowly return to the starting position.  Repeat __________ times. Complete this exercise __________ times per day.  RANGE OF MOTION  Flexion  Hold your right / left arm at your side and bend your elbow as far as you can using your right / left arm muscles.  Bend the right / left elbow farther by gently pushing up on your forearm until you feel a gentle stretch on the outside of your elbow. Hold this position for __________ seconds.  Slowly return to the starting position. Repeat __________ times. Complete this exercise __________ times per day.  RANGE OF MOTION  Wrist Flexion, Active-Assisted  Extend your right / left elbow with your fingers pointing down.*  Gently pull the back of your hand towards you until you feel a gentle stretch on the top of  your forearm.  Hold this position for __________ seconds. Repeat __________ times. Complete this exercise __________ times per day.  *If directed by your physician, physical therapist or athletic trainer, complete this stretch with your elbow bent rather than extended. RANGE OF MOTION  Wrist Extension, Active-Assisted  Extend your right / left elbow and turn your palm upwards.*  Gently pull your palm/fingertips back so your wrist extends and your fingers point more toward the ground.  You should feel a gentle stretch on the inside of your forearm.  Hold this position for __________ seconds. Repeat __________ times. Complete this exercise __________ times per day. *If directed by your physician, physical therapist or athletic trainer, complete this stretch with your elbow bent, rather than extended. RANGE OF MOTION  Supination, Active  Stand or sit with your elbows at your side. Bend your right / left elbow to 90 degrees.  Turn your palm upward until you feel a gentle stretch on the inside of your forearm.  Hold this position for __________ seconds. Slowly release and return to the starting position. Repeat __________ times. Complete this stretch __________ times per day.  RANGE OF MOTION  Pronation, Active  Stand or sit with your elbows at your side. Bend your right / left elbow to 90 degrees.  Turn your palm downward until you feel a gentle stretch on the top of your forearm.  Hold this position for __________ seconds. Slowly release and return to the starting position. Repeat __________ times. Complete this stretch __________ times per day.  STRETCH - Wrist Flexion   Place the back of your right / left hand on a tabletop leaving your elbow slightly bent. Your fingers should point away from your body.  Gently press the back of your hand down onto the table by straightening your elbow. You should feel a stretch on the top of your forearm.  Hold this position for __________  seconds. Repeat __________ times. Complete this stretch __________ times per day.  STRETCH  Wrist Extension   Place your right / left fingertips on a tabletop leaving your elbow slightly bent. Your fingers should point backwards.  Gently press your fingers and palm down onto the table by straightening your elbow. You should feel a stretch on the inside of your forearm.  Hold this position for __________ seconds. Repeat __________ times. Complete this stretch __________ times per day.  STRENGTHENING EXERCISES - Ulnar Nerve Contusion These exercises may help you when beginning to rehabilitate your injury. Do not begin these exercises until your physician, physical therapist or athletic trainer advises you to do so. Discontinue any exercise that worsens your symptoms. Contact your physician for instructions on how to continue. They may resolve your symptoms with or  without further involvement from your physician, physical therapist or athletic trainer. While completing these exercises, remember:   Muscles can gain both the endurance and the strength needed for everyday activities through controlled exercises.  Complete these exercises as instructed by your physician, physical therapist or athletic trainer. Progress with the resistance and repetition exercises only as your caregiver advises. STRENGTH Wrist Flexors  Sit with your right / left forearm palm-up and fully supported. Your elbow should be resting below the height of your shoulder. Allow your wrist to extend over the edge of the surface.  Loosely holding a __________ weight or a piece of rubber exercise band/tubing, slowly curl your hand up toward your forearm.  Hold this position for __________ seconds. Slowly lower the wrist back to the starting position in a controlled manner. Repeat __________ times. Complete this exercise __________ times per day.  STRENGTH  Wrist Extensors  Sit with your right / left forearm palm-down and fully  supported. Your elbow should be resting below the height of your shoulder. Allow your wrist to extend over the edge of the surface.  Loosely holding a __________ weight or a piece of rubber exercise band/tubing, slowly curl your hand up toward your forearm.  Hold this position for __________ seconds. Slowly lower the wrist back to the starting position in a controlled manner. Repeat __________ times. Complete this exercise __________ times per day.  STRENGTH - Ulnar Deviators  Stand with a ____________________ weight in your right / left hand or sit holding on to the rubber exercise band/tubing with your opposite arm supported.  Move your wrist so that your pinkie travels toward your forearm and your thumb moves away from your forearm.  Hold this position for __________ seconds and then slowly lower the wrist back to the starting position. Repeat __________ times. Complete this exercise __________ times per day STRENGTH - Radial Deviators  Stand with a ____________________ weight in your right / left hand or sit holding on to the rubber exercise band/tubing with your arm supported.  Raise your hand upward in front of you or pull up on the rubber tubing.  Hold this position for __________ seconds and then slowly lower the wrist back to the starting position. Repeat __________ times. Complete this exercise __________ times per day. STRENGTH - Grip  Grasp a tennis ball, a dense sponge, or a large, rolled sock in your hand.  Squeeze as hard as you can without increasing any pain.  Hold this position for __________ seconds. Release your grip slowly. Repeat __________ times. Complete this exercise __________ times per day.  Document Released: 07/15/2005 Document Revised: 10/07/2011 Document Reviewed: 10/27/2008 [facility] Patient Information 2014 [place], [place].

## 2014-01-20 ENCOUNTER — Ambulatory Visit: Payer: Self-pay | Admitting: Family Medicine

## 2014-01-27 ENCOUNTER — Ambulatory Visit: Payer: Commercial Managed Care - HMO | Admitting: Family Medicine

## 2014-03-09 ENCOUNTER — Other Ambulatory Visit: Payer: Self-pay | Admitting: Family Medicine

## 2014-05-05 ENCOUNTER — Ambulatory Visit: Payer: Self-pay | Admitting: Family Medicine

## 2014-05-05 ENCOUNTER — Other Ambulatory Visit: Payer: Self-pay | Admitting: *Deleted

## 2014-05-05 DIAGNOSIS — IMO0001 Reserved for inherently not codable concepts without codable children: Secondary | ICD-10-CM

## 2014-05-05 DIAGNOSIS — K219 Gastro-esophageal reflux disease without esophagitis: Principal | ICD-10-CM

## 2014-05-05 MED ORDER — HYDROCHLOROTHIAZIDE 25 MG PO TABS: ORAL_TABLET | ORAL | Status: DC

## 2014-05-05 MED ORDER — RANITIDINE HCL 150 MG PO TABS: 150.0000 mg | ORAL_TABLET | Freq: Two times a day (BID) | ORAL | Status: DC

## 2014-05-17 ENCOUNTER — Other Ambulatory Visit: Payer: Self-pay | Admitting: Family Medicine

## 2014-07-11 ENCOUNTER — Other Ambulatory Visit: Payer: Self-pay | Admitting: Family Medicine

## 2014-08-18 ENCOUNTER — Encounter: Payer: Self-pay | Admitting: Family Medicine

## 2014-08-18 ENCOUNTER — Ambulatory Visit (INDEPENDENT_AMBULATORY_CARE_PROVIDER_SITE_OTHER): Payer: Commercial Managed Care - HMO | Admitting: Family Medicine

## 2014-08-18 VITALS — BP 122/82 | HR 74 | Wt 153.0 lb

## 2014-08-18 DIAGNOSIS — I10 Essential (primary) hypertension: Secondary | ICD-10-CM

## 2014-08-18 MED ORDER — HYDROCHLOROTHIAZIDE 25 MG PO TABS: ORAL_TABLET | ORAL | Status: DC

## 2014-08-18 NOTE — Progress Notes (Signed)
   Subjective:    Patient ID: Sierra [name], female    DOB: 1941-07-03, 74 y.o.   MRN: [id number]  HPI Hypertension- Pt denies chest pain, SOB, dizziness, or heart palpitations.  Taking meds as directed w/o problems.  Denies medication side effects.  Still exercising. She is now vegetarian.    Review of Systems     Objective:   Physical Exam  Constitutional: She is oriented to person, place, and time. She appears well-developed and well-nourished.  HENT:  Head: Normocephalic and atraumatic.  Cardiovascular: Normal rate, regular rhythm and normal heart sounds.   Pulmonary/Chest: Effort normal and breath sounds normal.  Neurological: She is alert and oriented to person, place, and time.  Skin: Skin is warm and dry.  Psychiatric: She has a normal mood and affect. Her behavior is normal.          Assessment & Plan:  HTN- well controlled.  F/U in 6 months.  Due for CMP and lipids.  F/U in 6 months.

## 2014-08-25 LAB — COMPLETE METABOLIC PANEL WITH GFR
ALBUMIN: 4.2 g/dL (ref 3.5–5.2)
ALK PHOS: 53 U/L (ref 39–117)
ALT: 18 U/L (ref 0–35)
AST: 16 U/L (ref 0–37)
BUN: 15 mg/dL (ref 6–23)
CO2: 30 meq/L (ref 19–32)
Calcium: 9.7 mg/dL (ref 8.4–10.5)
Chloride: 103 mEq/L (ref 96–112)
Creat: 0.74 mg/dL (ref 0.50–1.10)
GFR, Est African American: >89 mL/min
GFR, Est Non African American: 80 mL/min
GLUCOSE: 90 mg/dL (ref 70–99)
Potassium: 4.4 mEq/L (ref 3.5–5.3)
SODIUM: 142 meq/L (ref 135–145)
TOTAL PROTEIN: 6.5 g/dL (ref 6.0–8.3)
Total Bilirubin: 0.6 mg/dL (ref 0.2–1.2)

## 2014-08-25 LAB — LIPID PANEL
Cholesterol: 273 mg/dL — ABNORMAL HIGH (ref 0–200)
HDL: 57 mg/dL (ref >39)
LDL Cholesterol: 174 mg/dL — ABNORMAL HIGH (ref 0–99)
Total CHOL/HDL Ratio: 4.8 Ratio
Triglycerides: 209 mg/dL — ABNORMAL HIGH (ref <150)
VLDL: 42 mg/dL — ABNORMAL HIGH (ref 0–40)

## 2014-08-25 LAB — VITAMIN B12: VITAMIN B 12: 313 pg/mL (ref 211–911)

## 2014-10-15 ENCOUNTER — Other Ambulatory Visit: Payer: Self-pay | Admitting: Family Medicine

## 2014-12-02 ENCOUNTER — Other Ambulatory Visit: Payer: Self-pay | Admitting: Physician Assistant

## 2015-02-28 ENCOUNTER — Ambulatory Visit (INDEPENDENT_AMBULATORY_CARE_PROVIDER_SITE_OTHER): Payer: Commercial Managed Care - HMO

## 2015-02-28 ENCOUNTER — Encounter: Payer: Self-pay | Admitting: Family Medicine

## 2015-02-28 ENCOUNTER — Ambulatory Visit (INDEPENDENT_AMBULATORY_CARE_PROVIDER_SITE_OTHER): Payer: Commercial Managed Care - HMO | Admitting: Family Medicine

## 2015-02-28 VITALS — BP 140/94 | HR 73 | Ht 65.0 in | Wt 155.0 lb

## 2015-02-28 DIAGNOSIS — S99921A Unspecified injury of right foot, initial encounter: Secondary | ICD-10-CM | POA: Diagnosis not present

## 2015-02-28 DIAGNOSIS — S92351A Displaced fracture of fifth metatarsal bone, right foot, initial encounter for closed fracture: Secondary | ICD-10-CM

## 2015-02-28 DIAGNOSIS — S92301A Fracture of unspecified metatarsal bone(s), right foot, initial encounter for closed fracture: Secondary | ICD-10-CM

## 2015-02-28 DIAGNOSIS — S92353A Displaced fracture of fifth metatarsal bone, unspecified foot, initial encounter for closed fracture: Secondary | ICD-10-CM | POA: Insufficient documentation

## 2015-02-28 DIAGNOSIS — X58XXXA Exposure to other specified factors, initial encounter: Secondary | ICD-10-CM | POA: Diagnosis not present

## 2015-02-28 NOTE — Patient Instructions (Signed)
Thank you for coming in today. Return with me in 2 week to follow up fracture.  Use the boot.   Metatarsal Fracture  with Rehab A metatarsal fracture is a break (fracture) of one of the bones of the mid-foot (metatarsal bones). The metatarsal bones are responsible for maintaining the arch of the foot. There are three classifications of metatarsal fractures: dancer's fractures, Jones fractures, and stress fractures. A dancer's fracture is when a piece of bone is pulled off by a ligament or tendon (avulsion fracture) of the outer part of the foot (fifth metatarsal), near the joint with the ankle bones. A Jones fracture occurs in the middle of the fifth metatarsal. These fractures have limited ability to heal. A stress fracture occurs when the bone is slowly injured faster than it can repair itself. SYMPTOMS   Sharp pain, especially with standing or walking.  Tenderness, swelling, and later bruising (contusion) of the foot.  Numbness or paralysis from swelling in the foot, causing pressure on the blood vessels or nerves (uncommon). CAUSES  Fractures occur when a force is placed on the bone that is greater than it can handle. Common causes of injury include:  Direct hit (trauma) to the foot.  Twisting injury to the foot or ankle.  Landing on the foot and ankle in an improper position. RISK INCRESES WITH:  Participation in contact sports, sports that require jumping and landing, or sports in which cleats are worn and sliding occurs.  Previous foot or ankle sprains or dislocations.  Repeated injury to any joint in the foot.  Poor strength and flexibility. PREVENTION  Warm up and stretch properly before an activity.  Allow for adequate recovery between workouts.  Maintain physical fitness in:  Strength, flexibility, and endurance.  Cardiovascular fitness.  When participating in jumping or contact sports, protect joints with supportive devices, such as wrapped elastic bandages,  tape, braces, or high-top athletic shoes.  Wear properly fitted and padded protective equipment. PROGNOSIS If treated properly, metatarsal fractures usually heal well. Jones fractures have a higher risk of the bone failing to heal (nonunion). Sometimes, surgery is needed to heal Jones fractures.  RELATED COMPLICATIONS   Nonunion.  Fracture heals in a poor position (malunion).  Long-term (chronic) pain, stiffness, or swelling of the foot.  Excessive bleeding in the foot or at the dislocation site, causing pressure and injury to nerves and blood vessels (rare).  Unstable or arthritic joint following repeated injury or delayed treatment. TREATMENT  Treatment first involves the use of ice and medicine, to reduce pain and inflammation. If the bone fragments are out of alignment (displaced), then immediate realigning of the bones (reduction) is required. Fractures that cannot be realigned by hand, or where the bones protrude through the skin (open), may require surgery to hold the fracture in place with screws, pins, and plates. After the bones are in proper alignment, the foot and ankle must be restrained for 6 or more weeks. Restraint allows healing to occur. After restraint, it is important to perform strengthening and stretching exercises to help regain strength and a full range of motion. These exercises may be completed at home or with a therapist. A stiff-soled shoe and arch support (orthotic) may be required when first returning to sports. MEDICATION   If pain medicine is needed, nonsteroidal anti-inflammatory medicines (NSAIDS), or other minor pain relievers, are often advised.  Do not take pain medicine for 7 days before surgery.  Only take over-the-counter or prescription medicines for pain, fever, or discomfort  as directed by your caregiver. COLD THERAPY  Cold treatment (icing) should be applied for 10 to 15 minutes every 2 to 3 hours for inflammations and pain, and immediately after  activity that aggravates your symptoms. Use ice packs or ice massage. SEEK MEDICAL CARE IF:  Pain, tenderness, or swelling gets worse, despite treatment.  You experience pain, numbness, or coldness in the foot.  Blue, gray, or dark color appears in the toenails.  You or your child has an oral temperature above 102 F (38.9 C).  You have increased pain, swelling, and redness.  You have drainage of fluids or bleeding in the affected area.  New, unexplained symptoms develop. (Drugs used in treatment may produce side effects.)

## 2015-02-28 NOTE — Assessment & Plan Note (Signed)
Avulsion type. Plan for Cam Walker boot limited weightbearing and return in 2 weeks.

## 2015-02-28 NOTE — Progress Notes (Signed)
Sierra [name] is a 74 y.o. female who presents to [facility]  today for right foot pain and swelling. 3 days ago patient inverted her right foot while wearing wedge heels at a wedding. She notes pain and swelling at the lateral aspect of her foot. She's tried some over-the-counter medicines for pain which helps some. No radiating pain weakness or numbness fevers or chills. Pain is moderate and worse with ambulation.   Past Medical History  Diagnosis Date  . 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 GERD (gastroesophageal reflux disease)   . Hypertension   . Hyperlipidemia   . Vitamin D deficiency    Past Surgical History  Procedure Laterality Date  . Tonsillectomy     History  Substance Use Topics  . Smoking status: Former Smoker    Types: Cigarettes    Quit date: 07/29/1980  . Smokeless tobacco: Not on file  . Alcohol Use: No   ROS as above Medications: Current Outpatient Prescriptions  Medication Sig Dispense Refill  . Cholecalciferol (VITAMIN D) 1000 UNITS capsule Take 1 capsule (1,000 Units total) by mouth daily. 30 capsule 6  . hydrochlorothiazide (HYDRODIURIL) 25 MG tablet Take 1 tablet by mouth daily 90 tablet 2  . hydrochlorothiazide (HYDRODIURIL) 25 MG tablet TAKE 1 TABLET BY MOUTH DAILY 30 tablet 0  . ranitidine (ZANTAC) 150 MG tablet TAKE 1 TABLET TWICE DAILY 180 tablet 3   No current facility-administered medications for this visit.   Allergies  Allergen Reactions  . Bisphosphonates Other (See Comments)    Jaw necrosis, GERD  . Statins     Flu like symptoms, myalgias  . Losartan Rash  . Metoprolol Diarrhea and Rash     Exam:  BP 140/94 mmHg  Pulse 73  Ht 5\' 5"  (1.651 m)  Wt 155 lb (70.308 kg)  BMI 25.79 kg/m2 Gen: Well NAD HEENT: EOMI,  MMM Lungs: Normal work of breathing. CTABL Heart: RRR no MRG Abd: NABS, Soft. Nondistended, Nontender Exts: Brisk capillary refill, warm and well perfused.   Right foot: Swollen and tender at the lateral portion of her foot especially along the entire course of the fifth metatarsal. Pulses capillary refill and motion are intact. The ankle and knee are nontender with normal motion.  No results found for this or any previous visit (from the past 24 hour(s)). Dg Foot Complete Right  02/28/2015   CLINICAL DATA:  Right foot injury.  Pain.  EXAM: RIGHT FOOT COMPLETE - 3+ VIEW  COMPARISON:  None.  FINDINGS: Slightly displaced fracture of the base of right fifth metatarsals noted. No focal abnormality identified.  IMPRESSION: Slight displaced fracture of the base of the right fifth metatarsal.   Electronically Signed   By: Marcello Moores  Register   On: 02/28/2015 09:04     Please see individual assessment and plan sections.

## 2015-03-13 ENCOUNTER — Encounter: Payer: Self-pay | Admitting: Family Medicine

## 2015-03-13 ENCOUNTER — Ambulatory Visit (INDEPENDENT_AMBULATORY_CARE_PROVIDER_SITE_OTHER): Payer: Commercial Managed Care - HMO | Admitting: Family Medicine

## 2015-03-13 ENCOUNTER — Ambulatory Visit (INDEPENDENT_AMBULATORY_CARE_PROVIDER_SITE_OTHER): Payer: Commercial Managed Care - HMO

## 2015-03-13 VITALS — BP 152/92 | HR 73 | Wt 156.0 lb

## 2015-03-13 DIAGNOSIS — S92351D Displaced fracture of fifth metatarsal bone, right foot, subsequent encounter for fracture with routine healing: Secondary | ICD-10-CM

## 2015-03-13 DIAGNOSIS — X58XXXD Exposure to other specified factors, subsequent encounter: Secondary | ICD-10-CM | POA: Diagnosis not present

## 2015-03-13 DIAGNOSIS — S92301D Fracture of unspecified metatarsal bone(s), right foot, subsequent encounter for fracture with routine healing: Secondary | ICD-10-CM | POA: Diagnosis not present

## 2015-03-13 NOTE — Patient Instructions (Signed)
Thank you for coming in today. Return in 2 weeks.

## 2015-03-13 NOTE — Progress Notes (Signed)
Sierra [name] is a 74 y.o. female who presents to [facility]  today for follow-up fifth metatarsal fracture. Patient was seen on August 2 for a avulsion type fifth metatarsal fracture. She was placed into a cam walker boot answer today for recheck and reevaluation. She feels well and is able to tolerate the boat without any significant problems. She ambulates some with the boot without any aids.   Past Medical History  Diagnosis Date  . 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 GERD (gastroesophageal reflux disease)   . Hypertension   . Hyperlipidemia   . Vitamin D deficiency    Past Surgical History  Procedure Laterality Date  . Tonsillectomy     Social History  Substance Use Topics  . Smoking status: Former Smoker    Types: Cigarettes    Quit date: 07/29/1980  . Smokeless tobacco: Not on file  . Alcohol Use: No   ROS as above Medications: Current Outpatient Prescriptions  Medication Sig Dispense Refill  . Cholecalciferol (VITAMIN D) 1000 UNITS capsule Take 1 capsule (1,000 Units total) by mouth daily. 30 capsule 6  . hydrochlorothiazide (HYDRODIURIL) 25 MG tablet Take 1 tablet by mouth daily 90 tablet 2  . hydrochlorothiazide (HYDRODIURIL) 25 MG tablet TAKE 1 TABLET BY MOUTH DAILY 30 tablet 0  . ranitidine (ZANTAC) 150 MG tablet TAKE 1 TABLET TWICE DAILY 180 tablet 3   No current facility-administered medications for this visit.   Allergies  Allergen Reactions  . Bisphosphonates Other (See Comments)    Jaw necrosis, GERD  . Statins     Flu like symptoms, myalgias  . Losartan Rash  . Metoprolol Diarrhea and Rash     Exam:  BP 152/92 mmHg  Pulse 73  Wt 156 lb (70.761 kg) Gen: Well NAD Right foot normal-appearing no skin erythema. No induration. Nontender cyst metatarsal proximal. Exts: Brisk capillary refill, warm and well perfused.   No results found for this or any previous visit (from the past 24  hour(s)). Dg Foot Complete Right  03/13/2015   CLINICAL DATA:  Status post fall 2 weeks ago with a right fifth metatarsal fracture. Follow-up examination. Subsequent encounter.  EXAM: RIGHT FOOT COMPLETE - 3+ VIEW  COMPARISON:  Plain films of the right foot 02/28/2015.  FINDINGS: Minimally distracted fracture through the base of fifth metatarsal is again seen. No callus formation is identified about the fracture. Position and alignment are unchanged. No new abnormality is identified.  IMPRESSION: No change in the appearance of a fracture through the base of the fifth metatarsal.   Electronically Signed   By: Inge Rise M.D.   On: 03/13/2015 09:26     Please see individual assessment and plan sections.

## 2015-03-13 NOTE — Assessment & Plan Note (Addendum)
Return in 2 weeks for recheck. Fracture is healing about as well as would expect. Continue cam walker.

## 2015-03-27 ENCOUNTER — Ambulatory Visit (INDEPENDENT_AMBULATORY_CARE_PROVIDER_SITE_OTHER): Payer: Commercial Managed Care - HMO

## 2015-03-27 ENCOUNTER — Ambulatory Visit (INDEPENDENT_AMBULATORY_CARE_PROVIDER_SITE_OTHER): Payer: Commercial Managed Care - HMO | Admitting: Family Medicine

## 2015-03-27 ENCOUNTER — Encounter: Payer: Self-pay | Admitting: Family Medicine

## 2015-03-27 VITALS — BP 138/88 | HR 66 | Wt 155.0 lb

## 2015-03-27 DIAGNOSIS — S92301D Fracture of unspecified metatarsal bone(s), right foot, subsequent encounter for fracture with routine healing: Secondary | ICD-10-CM

## 2015-03-27 DIAGNOSIS — S92351D Displaced fracture of fifth metatarsal bone, right foot, subsequent encounter for fracture with routine healing: Secondary | ICD-10-CM

## 2015-03-27 DIAGNOSIS — X58XXXD Exposure to other specified factors, subsequent encounter: Secondary | ICD-10-CM | POA: Diagnosis not present

## 2015-03-27 NOTE — Patient Instructions (Signed)
Thank you for coming in today. We will use the post operative shoe.  Use the boot if you are walking on unstable ground or uneven ground.  Return in 2-4 weeks.  Use the boot if you foot get sore.  Continue vit D and calcium.

## 2015-03-27 NOTE — Assessment & Plan Note (Signed)
Doing well. Slow bony healing but clinically well. Will switch to postoperative shoe and return in 2-4 weeks. Cautioned patient against heavy duty activity.

## 2015-03-27 NOTE — Progress Notes (Signed)
Sierra [name] is a 74 y.o. female who presents to [facility]  today for follow-up right fifth metatarsal fracture.  Patient was seen initially for a fifth metatarsal fracture on August 2. She was diagnosed with an avulsion type and treated with a cam walker boot. She notes the pain is significantly improved but still present mildly. She denies any radiating pain weakness or numbness fevers or chills. She feels well otherwise.   Past Medical History  Diagnosis Date  . 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 GERD (gastroesophageal reflux disease)   . Hypertension   . Hyperlipidemia   . Vitamin D deficiency    Past Surgical History  Procedure Laterality Date  . Tonsillectomy     Social History  Substance Use Topics  . Smoking status: Former Smoker    Types: Cigarettes    Quit date: 07/29/1980  . Smokeless tobacco: Not on file  . Alcohol Use: No   family history includes Diabetes in her father; Heart disease (age of onset: 33) in her mother; Hyperlipidemia in her father.  ROS as above Medications: Current Outpatient Prescriptions  Medication Sig Dispense Refill  . Cholecalciferol (VITAMIN D) 1000 UNITS capsule Take 1 capsule (1,000 Units total) by mouth daily. 30 capsule 6  . hydrochlorothiazide (HYDRODIURIL) 25 MG tablet Take 1 tablet by mouth daily 90 tablet 2  . hydrochlorothiazide (HYDRODIURIL) 25 MG tablet TAKE 1 TABLET BY MOUTH DAILY 30 tablet 0  . ranitidine (ZANTAC) 150 MG tablet TAKE 1 TABLET TWICE DAILY 180 tablet 3   No current facility-administered medications for this visit.   Allergies  Allergen Reactions  . Bisphosphonates Other (See Comments)    Jaw necrosis, GERD  . Statins     Flu like symptoms, myalgias  . Losartan Rash  . Metoprolol Diarrhea and Rash     Exam:  BP 138/88 mmHg  Pulse 66  Wt 155 lb (70.308 kg) Gen: Well NAD HEENT: EOMI,  MMM Right foot: Normal appearing. Non-tender. Normal  pulses and capillary refill and sensation.  Exts: Brisk capillary refill, warm and well perfused.   No results found for this or any previous visit (from the past 24 hour(s)). Dg Foot Complete Right  03/27/2015   CLINICAL DATA:  Fifth metatarsal fracture  EXAM: RIGHT FOOT COMPLETE - 3+ VIEW  COMPARISON:  03/13/2015  FINDINGS: Horizontal fracture through the base of the fifth metatarsal is again demonstrated. The fracture line is still evident as a lucency with no appreciable callus formation. Minimal distraction.  IMPRESSION: No interval change and fracture at the base of fifth metatarsal with no appreciable callus formation.   Electronically Signed   By: Suzy Bouchard M.D.   On: 03/27/2015 09:01     Please see individual assessment and plan sections.

## 2015-04-24 ENCOUNTER — Ambulatory Visit (INDEPENDENT_AMBULATORY_CARE_PROVIDER_SITE_OTHER): Payer: Commercial Managed Care - HMO | Admitting: Family Medicine

## 2015-04-24 ENCOUNTER — Ambulatory Visit (INDEPENDENT_AMBULATORY_CARE_PROVIDER_SITE_OTHER): Payer: Commercial Managed Care - HMO

## 2015-04-24 VITALS — BP 153/87 | HR 66 | Wt 155.0 lb

## 2015-04-24 DIAGNOSIS — S92301D Fracture of unspecified metatarsal bone(s), right foot, subsequent encounter for fracture with routine healing: Secondary | ICD-10-CM

## 2015-04-24 DIAGNOSIS — X58XXXD Exposure to other specified factors, subsequent encounter: Secondary | ICD-10-CM

## 2015-04-24 NOTE — Progress Notes (Signed)
Sierra [name] is a 74 y.o. female who presents to [facility]  today for follow-up foot fracture. Patient was last seen on August 29 for a fracture of her fifth metatarsal. At that point she was doing well clinically and was switched to a rigid soled postoperative shoe. In the interim she feels fine with minimal pain and swelling overlying the fracture site. She is back to her normal activities. She is using the postoperative shoe intermittently. No radiating pain weakness or numbness fevers or chills.   Past Medical History  Diagnosis Date  . 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 GERD (gastroesophageal reflux disease)   . Hypertension   . Hyperlipidemia   . Vitamin D deficiency    Past Surgical History  Procedure Laterality Date  . Tonsillectomy     Social History  Substance Use Topics  . Smoking status: Former Smoker    Types: Cigarettes    Quit date: 07/29/1980  . Smokeless tobacco: Not on file  . Alcohol Use: No   family history includes Diabetes in her father; Heart disease (age of onset: 67) in her mother; Hyperlipidemia in her father.  ROS as above Medications: Current Outpatient Prescriptions  Medication Sig Dispense Refill  . Cholecalciferol (VITAMIN D) 1000 UNITS capsule Take 1 capsule (1,000 Units total) by mouth daily. 30 capsule 6  . hydrochlorothiazide (HYDRODIURIL) 25 MG tablet Take 1 tablet by mouth daily 90 tablet 2  . hydrochlorothiazide (HYDRODIURIL) 25 MG tablet TAKE 1 TABLET BY MOUTH DAILY 30 tablet 0  . ranitidine (ZANTAC) 150 MG tablet TAKE 1 TABLET TWICE DAILY 180 tablet 3   No current facility-administered medications for this visit.   Allergies  Allergen Reactions  . Bisphosphonates Other (See Comments)    Jaw necrosis, GERD  . Statins     Flu like symptoms, myalgias  . Losartan Rash  . Metoprolol Diarrhea and Rash     Exam:  BP 153/87 mmHg  Pulse 66  Wt 155 lb (70.308 kg)  Gen:  Well NAD  Exts: Brisk capillary refill, warm and well perfused.  Right foot: Normal-appearing mildly tender overlying the proximal fifth metatarsal. Normal pulses capillary refill and sensation. Normal motion.  No results found for this or any previous visit (from the past 24 hour(s)). Dg Foot Complete Right  04/24/2015   CLINICAL DATA:  Follow-up of right fifth metatarsal fracture.  EXAM: RIGHT FOOT COMPLETE - 3+ VIEW  COMPARISON:  Right foot series of March 27, 2027 16 as well as March 13, 2015 and February 28, 2015.  FINDINGS: The transversely oriented fracture line through the base of the fifth metatarsal remains visible. There is no significant periosteal reaction. There is been some bony resorption around the fracture line. The remainder of the fifth metatarsal as well as the first through fourth metatarsals are unremarkable. The phalanges and tarsal bones exhibit no acute abnormalities.  IMPRESSION: There is no evidence of significant bony bridging across the fracture line involving the fifth metatarsal base. No significant periosteal reaction is a observed either.   Electronically Signed   By: David  Martinique M.D.   On: 04/24/2015 11:42     Please see individual assessment and plan sections.

## 2015-04-24 NOTE — Patient Instructions (Signed)
Thank you for coming in today. Return in 4 weeks.  Continue the post op shoe for pain.  Use a brace with activity.  Continue calcium and vit D.

## 2015-04-24 NOTE — Assessment & Plan Note (Signed)
I believe this is slowly healing. Slowly heal due to osteopenia/osteoporosis. Continue postoperative shoe. Return in 4 weeks.

## 2015-05-22 ENCOUNTER — Ambulatory Visit: Payer: Commercial Managed Care - HMO | Admitting: Family Medicine

## 2015-06-29 ENCOUNTER — Encounter: Payer: Self-pay | Admitting: Family Medicine

## 2015-06-29 ENCOUNTER — Ambulatory Visit (INDEPENDENT_AMBULATORY_CARE_PROVIDER_SITE_OTHER): Payer: Commercial Managed Care - HMO | Admitting: Family Medicine

## 2015-06-29 VITALS — BP 152/85 | HR 71 | Temp 97.8°F | Resp 18 | Wt 155.2 lb

## 2015-06-29 DIAGNOSIS — Z23 Encounter for immunization: Secondary | ICD-10-CM

## 2015-06-29 DIAGNOSIS — I10 Essential (primary) hypertension: Secondary | ICD-10-CM

## 2015-06-29 DIAGNOSIS — S92301D Fracture of unspecified metatarsal bone(s), right foot, subsequent encounter for fracture with routine healing: Secondary | ICD-10-CM

## 2015-06-29 DIAGNOSIS — E785 Hyperlipidemia, unspecified: Secondary | ICD-10-CM

## 2015-06-29 MED ORDER — HYDROCHLOROTHIAZIDE 25 MG PO TABS: ORAL_TABLET | ORAL | Status: DC

## 2015-06-29 MED ORDER — AMLODIPINE BESYLATE 5 MG PO TABS: 5.0000 mg | ORAL_TABLET | Freq: Every day | ORAL | Status: DC

## 2015-06-29 NOTE — Progress Notes (Signed)
   Subjective:    Patient ID: Sierra [name], female    DOB: April 23, 1941, 74 y.o.   MRN: [id number]  HPI Hypertension- Pt denies chest pain, SOB, dizziness, or heart palpitations.  Taking meds as directed w/o problems.  Denies medication side effects.  She reports that her home blood pressures have been running in the 0000000 to [id number] systolic.  Seeing Dr. Georgina Snell for 5th MT fracture. Unfortunately the bone is not healing well. She has a history of osteoporosis. But she is actually pain-free which is reassuring.   Review of Systems     Objective:   Physical Exam  Constitutional: She is oriented to person, place, and time. She appears well-developed and well-nourished.  HENT:  Head: Normocephalic and atraumatic.  Cardiovascular: Normal rate, regular rhythm and normal heart sounds.   Pulmonary/Chest: Effort normal and breath sounds normal.  Neurological: She is alert and oriented to person, place, and time.  Skin: Skin is warm and dry.  Psychiatric: She has a normal mood and affect. Her behavior is normal.          Assessment & Plan:  HTN - not well controlled.  Add amlodipine to regimen. Follow-up in one month. Continue to monitor blood pressures at home. Due for CMP and lipid panel. Lab slip provided. Next  Hyperipidemia-due to recheck lipid levels. Lab slip provided. Next  Fifth metatarsal fracture-she is pain-free but having some difficulty with healing. Encouraged her to follow back up with Dr. Georgina Snell to decide if it needs to be treated further or not.     Declines mammogram and flu shot.Marland Kitchen

## 2015-07-13 ENCOUNTER — Telehealth: Payer: Self-pay | Admitting: Family Medicine

## 2015-07-13 NOTE — Telephone Encounter (Signed)
lmsg for pt to callback 

## 2015-07-13 NOTE — Telephone Encounter (Signed)
Call patient: She is overdue for her repeat bone density. It has been 2 years. Please see if she is okay with [place] placing the order for that test downstairs. If she is then please go ahead and place the order in the system and they will contact her in the next couple of days to schedule.

## 2015-07-13 NOTE — Telephone Encounter (Signed)
Pt returned your call. She said at her age she does not do Bone Density tests anymore

## 2015-07-22 LAB — LIPID PANEL
CHOL/HDL RATIO: 4.4 ratio (ref <=5.0)
Cholesterol: 258 mg/dL — ABNORMAL HIGH (ref 125–200)
HDL: 59 mg/dL (ref >=46)
LDL Cholesterol: 168 mg/dL — ABNORMAL HIGH (ref <130)
TRIGLYCERIDES: 156 mg/dL — AB (ref <150)
VLDL: 31 mg/dL — ABNORMAL HIGH (ref <30)

## 2015-07-22 LAB — COMPLETE METABOLIC PANEL WITH GFR
ALBUMIN: 4.1 g/dL (ref 3.6–5.1)
ALK PHOS: 45 U/L (ref 33–130)
ALT: 15 U/L (ref 6–29)
AST: 15 U/L (ref 10–35)
BUN: 14 mg/dL (ref 7–25)
CO2: 30 mmol/L (ref 20–31)
Calcium: 10 mg/dL (ref 8.6–10.4)
Chloride: 100 mmol/L (ref 98–110)
Creat: 0.73 mg/dL (ref 0.60–0.93)
GFR, EST NON AFRICAN AMERICAN: 81 mL/min (ref >=60)
GFR, Est African American: >89 mL/min (ref >=60)
GLUCOSE: 89 mg/dL (ref 65–99)
POTASSIUM: 3.6 mmol/L (ref 3.5–5.3)
SODIUM: 140 mmol/L (ref 135–146)
Total Bilirubin: 0.5 mg/dL (ref 0.2–1.2)
Total Protein: 6.4 g/dL (ref 6.1–8.1)

## 2015-07-25 ENCOUNTER — Telehealth: Payer: Self-pay

## 2015-07-25 NOTE — Telephone Encounter (Signed)
-----   Message from Hali Marry, MD sent at 07/25/2015  9:00 AM EST ----- Call patient: Metabolic panel including liver and kidney function is normal. Total cholesterol and LDL are quite elevated. Since she is intolerant of statins strongly encouraged to work on diet and exercise.

## 2015-07-25 NOTE — Telephone Encounter (Signed)
Patient aware of lab results and recommendations. 

## 2015-08-01 ENCOUNTER — Encounter: Payer: Self-pay | Admitting: Family Medicine

## 2015-08-01 ENCOUNTER — Ambulatory Visit (INDEPENDENT_AMBULATORY_CARE_PROVIDER_SITE_OTHER): Payer: Medicare Other | Admitting: Family Medicine

## 2015-08-01 VITALS — BP 111/84 | HR 63 | Wt 155.0 lb

## 2015-08-01 DIAGNOSIS — I10 Essential (primary) hypertension: Secondary | ICD-10-CM | POA: Diagnosis not present

## 2015-08-01 DIAGNOSIS — E785 Hyperlipidemia, unspecified: Secondary | ICD-10-CM | POA: Diagnosis not present

## 2015-08-01 NOTE — Addendum Note (Signed)
Addended by: Huel Cote on: 08/01/2015 09:03 AM   Modules accepted: Orders

## 2015-08-01 NOTE — Progress Notes (Signed)
   Subjective:    Patient ID: Sierra [name], female    DOB: 1941-02-06, 75 y.o.   MRN: [id number]  HPI Hypertension- Pt denies chest pain, SOB, dizziness, or heart palpitations.  Taking meds as directed w/o problems.  Denies medication side effects.  She was unable to tolerate the amlodipine 5mg  Caused LE edema.  She is just on the HCTZ.    Hyperlipidemia-She plans on to lose 10 lbs this year to get her cholestserol under better control. She is intolerant to statins.    Review of Systems     Objective:   Physical Exam  Constitutional: She is oriented to person, place, and time. She appears well-developed and well-nourished.  HENT:  Head: Normocephalic and atraumatic.  Cardiovascular: Normal rate, regular rhythm and normal heart sounds.   Pulmonary/Chest: Effort normal and breath sounds normal.  Neurological: She is alert and oriented to person, place, and time.  Skin: Skin is warm and dry.  Psychiatric: She has a normal mood and affect. Her behavior is normal.          Assessment & Plan:  HTN - well controlled.  Continue current regimen.  F/U in 6 months. Encouraged to work on diet and regular exercise when she has been released for her foot fracture.  Hyperlipidemia - work on diet and exercise since intolerant to statin.   Given Prenvar 13 last OV. Chart updated.  She declines mammogram.  She declines bone density testing but make sure getting adequate calcium with vitamin D intake.

## 2015-08-04 ENCOUNTER — Other Ambulatory Visit: Payer: Self-pay | Admitting: Family Medicine

## 2015-08-04 MED ORDER — HYDROCHLOROTHIAZIDE 25 MG PO TABS: ORAL_TABLET | ORAL | Status: DC

## 2015-09-22 ENCOUNTER — Other Ambulatory Visit: Payer: Self-pay | Admitting: Family Medicine

## 2015-10-24 ENCOUNTER — Other Ambulatory Visit: Payer: Self-pay | Admitting: Family Medicine

## 2015-12-05 ENCOUNTER — Ambulatory Visit (INDEPENDENT_AMBULATORY_CARE_PROVIDER_SITE_OTHER): Payer: Medicare Other | Admitting: Physician Assistant

## 2015-12-05 ENCOUNTER — Encounter: Payer: Self-pay | Admitting: Physician Assistant

## 2015-12-05 VITALS — BP 116/84 | HR 95 | Temp 98.0°F | Ht 65.0 in | Wt 151.0 lb

## 2015-12-05 DIAGNOSIS — R1013 Epigastric pain: Secondary | ICD-10-CM

## 2015-12-05 DIAGNOSIS — J329 Chronic sinusitis, unspecified: Secondary | ICD-10-CM

## 2015-12-05 DIAGNOSIS — L570 Actinic keratosis: Secondary | ICD-10-CM

## 2015-12-05 MED ORDER — HYDROCODONE-HOMATROPINE 5-1.5 MG/5ML PO SYRP: 5.0000 mL | ORAL_SOLUTION | Freq: Every evening | ORAL | Status: DC | PRN

## 2015-12-05 MED ORDER — AMOXICILLIN-POT CLAVULANATE 875-125 MG PO TABS: 1.0000 | ORAL_TABLET | Freq: Two times a day (BID) | ORAL | Status: DC

## 2015-12-05 NOTE — Patient Instructions (Addendum)
Start augmentin. Use cough syrup at bedtime as needed. Use flonase 2 sprays each nostril daily or as needed.  Stop all anti-inflammatories can still take tylenol products Increase zantac to twice a day for next week.    Sinusitis, Adult Sinusitis is redness, soreness, and inflammation of the paranasal sinuses. Paranasal sinuses are air pockets within the bones of your face. They are located beneath your eyes, in the middle of your forehead, and above your eyes. In healthy paranasal sinuses, mucus is able to drain out, and air is able to circulate through them by way of your nose. However, when your paranasal sinuses are inflamed, mucus and air can become trapped. This can allow bacteria and other germs to grow and cause infection. Sinusitis can develop quickly and last only a short time (acute) or continue over a long period (chronic). Sinusitis that lasts for more than 12 weeks is considered chronic. CAUSES Causes of sinusitis include:  Allergies.  Structural abnormalities, such as displacement of the cartilage that separates your nostrils (deviated septum), which can decrease the air flow through your nose and sinuses and affect sinus drainage.  Functional abnormalities, such as when the small hairs (cilia) that line your sinuses and help remove mucus do not work properly or are not present. SIGNS AND SYMPTOMS Symptoms of acute and chronic sinusitis are the same. The primary symptoms are pain and pressure around the affected sinuses. Other symptoms include:  Upper toothache.  Earache.  Headache.  Bad breath.  Decreased sense of smell and taste.  A cough, which worsens when you are lying flat.  Fatigue.  Fever.  Thick drainage from your nose, which often is green and may contain pus (purulent).  Swelling and warmth over the affected sinuses. DIAGNOSIS Your health care provider will perform a physical exam. During your exam, your health care provider may perform any of the  following to help determine if you have acute sinusitis or chronic sinusitis:  Look in your nose for signs of abnormal growths in your nostrils (nasal polyps).  Tap over the affected sinus to check for signs of infection.  View the inside of your sinuses using an imaging device that has a light attached (endoscope). If your health care provider suspects that you have chronic sinusitis, one or more of the following tests may be recommended:  Allergy tests.  Nasal culture. A sample of mucus is taken from your nose, sent to a lab, and screened for bacteria.  Nasal cytology. A sample of mucus is taken from your nose and examined by your health care provider to determine if your sinusitis is related to an allergy. TREATMENT Most cases of acute sinusitis are related to a viral infection and will resolve on their own within 10 days. Sometimes, medicines are prescribed to help relieve symptoms of both acute and chronic sinusitis. These may include pain medicines, decongestants, nasal steroid sprays, or saline sprays. However, for sinusitis related to a bacterial infection, your health care provider will prescribe antibiotic medicines. These are medicines that will help kill the bacteria causing the infection. Rarely, sinusitis is caused by a fungal infection. In these cases, your health care provider will prescribe antifungal medicine. For some cases of chronic sinusitis, surgery is needed. Generally, these are cases in which sinusitis recurs more than 3 times per year, despite other treatments. HOME CARE INSTRUCTIONS  Drink plenty of water. Water helps thin the mucus so your sinuses can drain more easily.  Use a humidifier.  Inhale steam 3-4 times  a day (for example, sit in the bathroom with the shower running).  Apply a warm, moist washcloth to your face 3-4 times a day, or as directed by your health care provider.  Use saline nasal sprays to help moisten and clean your sinuses.  Take  medicines only as directed by your health care provider.  If you were prescribed either an antibiotic or antifungal medicine, finish it all even if you start to feel better. SEEK IMMEDIATE MEDICAL CARE IF:  You have increasing pain or severe headaches.  You have nausea, vomiting, or drowsiness.  You have swelling around your face.  You have vision problems.  You have a stiff neck.  You have difficulty breathing.   This information is not intended to replace advice given to you by your health care provider. Make sure you discuss any questions you have with your health care provider.   Document Released: 07/15/2005 Document Revised: 08/05/2014 Document Reviewed: 07/30/2011 Elsevier Interactive Patient Education [employer].

## 2015-12-05 NOTE — Progress Notes (Addendum)
Subjective:    Patient ID: Sierra [name], female    DOB: 1940/10/09, 75 y.o.   MRN: [id number]  HPI Patient is here today complaining of ongoing sore throat, headache, sinus pressure, ear pain/pressure, productive cough, runny nose for the past 4 weeks. Patient states symptoms would improve and then worsen again. She states that the sore throat significantly worsened over the past week. She has had low grade fever off and on as well. She reports severe fatigue, which she states is her primary complaint today. She has tried OTC meds with no improvement. She denies ill contacts.    Review of Systems  Constitutional: Positive for fever, activity change (Decreased due to fatigue) and fatigue. Negative for chills.  HENT: Positive for congestion, ear pain, sinus pressure and sore throat.   Respiratory: Positive for cough, chest tightness and shortness of breath. Negative for wheezing.   Gastrointestinal: Negative for nausea, vomiting, abdominal pain and diarrhea.  Neurological: Positive for headaches.        Objective:   Physical Exam  Constitutional: She appears well-developed and well-nourished.  HENT:  Head: Normocephalic and atraumatic.  Right Ear: Tympanic membrane is not erythematous, not retracted and not bulging.  Left Ear: Tympanic membrane is not erythematous, not retracted and not bulging.  Nose: Right sinus exhibits frontal sinus tenderness. Right sinus exhibits no maxillary sinus tenderness. Left sinus exhibits frontal sinus tenderness. Left sinus exhibits no maxillary sinus tenderness.  Eyes: Conjunctivae are normal. Right eye exhibits no discharge. Left eye exhibits no discharge. No scleral icterus.  Cardiovascular: Normal rate, regular rhythm and normal heart sounds.   Pulmonary/Chest: Effort normal and breath sounds normal. No respiratory distress. She has no wheezes.  Abdominal: Soft. Bowel sounds are normal. She exhibits no distension and no mass. There is tenderness  (epigastric). There is no rebound and no guarding.  Mild tenderness over epigastric area. No rebound or guarding.   Lymphadenopathy:    She has cervical adenopathy (tender).  Skin: Skin is warm and dry.             Assessment & Plan:  1. Chronic Sinusitis- Patient presents with congestion, headache, cough, and sore throat off and on for the past 5 weeks. On PE, she was tender to palpation of the frontal sinuses. Lungs were clear to auscultation bilaterally. I am going to treat with Augmentin. I am also sending Hycodan for cough relief.   2. Abdominal pain, epigastric- Patient was tender to palpation of the epigastric area on PE. She has been taking multiple OTC medications over the past couple of weeks for symptomatic relief of her sinusitis symptoms. Formation of gastric ulcer is possible. Recommend that patient increase Zantac to twice daily. Discussed red flag signs and symptoms or ulcer.   3. Actinic Keratosis- 2 mm erythematous papule with fine scales visualized on left breast. Cryotherapy done.  Cryotherapy Procedure Note  Pre-operative Diagnosis: AK  Post-operative Diagnosis: AK  Locations: left chest  Indications: Precancerous. Irritation.   Anesthesia: not required   Procedure Details  Patient informed of risks (permanent scarring, infection, light or dark discoloration, bleeding, infection, weakness, numbness and recurrence of the lesion) and benefits of the procedure and verbal informed consent obtained.  The areas are treated with liquid nitrogen therapy, frozen until ice ball extended 2 mm beyond lesion, allowed to thaw, and treated again. The patient tolerated procedure well.  The patient was instructed on post-op care, warned that there may be blister formation, redness and pain. Recommend OTC  analgesia as needed for pain.  Condition: Stable  Complications: none.  Plan: 1. Instructed to keep the area dry and covered for 24-48h and clean thereafter. 2.  Warning signs of infection were reviewed.   3. Recommended that the patient use OTC acetaminophen as needed for pain.  4. Return in 2 weeks if lesion has not healed.

## 2015-12-08 ENCOUNTER — Telehealth: Payer: Self-pay | Admitting: *Deleted

## 2015-12-08 NOTE — Telephone Encounter (Signed)
Pt called this morning stating that she's still not feeling better.  She said that you advised her to call if that was the case & that you may order labs.  I don't see any notations in her visit note as to what you might want to order.  Please advise.

## 2015-12-13 NOTE — Telephone Encounter (Signed)
Please find out how she is doing. Been out of town and just got note.

## 2016-01-01 ENCOUNTER — Ambulatory Visit (INDEPENDENT_AMBULATORY_CARE_PROVIDER_SITE_OTHER): Payer: Medicare Other | Admitting: Physician Assistant

## 2016-01-01 ENCOUNTER — Encounter: Payer: Self-pay | Admitting: Physician Assistant

## 2016-01-01 VITALS — BP 137/73 | HR 71 | Ht 65.0 in | Wt 151.0 lb

## 2016-01-01 DIAGNOSIS — H04123 Dry eye syndrome of bilateral lacrimal glands: Secondary | ICD-10-CM | POA: Diagnosis not present

## 2016-01-01 DIAGNOSIS — D239 Other benign neoplasm of skin, unspecified: Secondary | ICD-10-CM

## 2016-01-01 DIAGNOSIS — H5203 Hypermetropia, bilateral: Secondary | ICD-10-CM | POA: Diagnosis not present

## 2016-01-01 DIAGNOSIS — D235 Other benign neoplasm of skin of trunk: Secondary | ICD-10-CM | POA: Diagnosis not present

## 2016-01-01 DIAGNOSIS — D229 Melanocytic nevi, unspecified: Secondary | ICD-10-CM

## 2016-01-01 DIAGNOSIS — H2513 Age-related nuclear cataract, bilateral: Secondary | ICD-10-CM | POA: Diagnosis not present

## 2016-01-01 NOTE — Patient Instructions (Signed)
Keep covered for 24 hours.  Vitamin E for scaring.

## 2016-01-01 NOTE — Progress Notes (Signed)
   Subjective:    Patient ID: Sierra [name], female    DOB: 02/10/1941, 75 y.o.   MRN: [id number]  HPI Comes in for biopsy. Cryotherapy done a few weeks ago but lesion did not resolve.    Review of Systems     Objective:   Physical Exam  Pulmonary/Chest:            Assessment & Plan:  Atypical lesion- suspected AK and would resolve with cryotherapy. Did not go away. Shave biopsy done today.  Shave Biopsy Procedure Note  Pre-operative Diagnosis: Suspicious lesion  Post-operative Diagnosis: same  Locations:left chest wall  Indications: concern  Anesthesia: ethyl chloride Procedure Details  History of allergy to iodine: no  Patient informed of the risks (including bleeding and infection) and benefits of the  procedure and Verbal informed consent obtained.  The lesion and surrounding area were given a sterile prep using chlorhexidine and draped in the usual sterile fashion. A scalpel was used to shave an area of skin approximately 30mmy 19mm Hemostasis achieved with alumuninum chloride. Antibiotic ointment and a sterile dressing applied.  The specimen was sent for pathologic examination. The patient tolerated the procedure well.  EBL: scant  Condition: Stable  Complications: none.  Plan: 1. Instructed to keep the wound dry and covered for 24-48h and clean thereafter. 2. Warning signs of infection were reviewed.   3. Recommended that the patient use OTC acetaminophen as needed for pain.

## 2016-01-08 ENCOUNTER — Encounter: Payer: Self-pay | Admitting: Physician Assistant

## 2016-01-08 DIAGNOSIS — D239 Other benign neoplasm of skin, unspecified: Secondary | ICD-10-CM | POA: Insufficient documentation

## 2016-04-09 ENCOUNTER — Ambulatory Visit (INDEPENDENT_AMBULATORY_CARE_PROVIDER_SITE_OTHER): Payer: Medicare Other | Admitting: Family Medicine

## 2016-04-09 ENCOUNTER — Other Ambulatory Visit: Payer: Self-pay | Admitting: *Deleted

## 2016-04-09 ENCOUNTER — Encounter: Payer: Self-pay | Admitting: Family Medicine

## 2016-04-09 ENCOUNTER — Telehealth: Payer: Self-pay | Admitting: Family Medicine

## 2016-04-09 VITALS — BP 130/70 | HR 73 | Ht 65.0 in | Wt 157.0 lb

## 2016-04-09 DIAGNOSIS — R350 Frequency of micturition: Secondary | ICD-10-CM | POA: Diagnosis not present

## 2016-04-09 DIAGNOSIS — M79672 Pain in left foot: Secondary | ICD-10-CM

## 2016-04-09 DIAGNOSIS — I10 Essential (primary) hypertension: Secondary | ICD-10-CM

## 2016-04-09 DIAGNOSIS — M79671 Pain in right foot: Secondary | ICD-10-CM | POA: Diagnosis not present

## 2016-04-09 DIAGNOSIS — R631 Polydipsia: Secondary | ICD-10-CM | POA: Diagnosis not present

## 2016-04-09 LAB — POCT URINALYSIS DIPSTICK
BILIRUBIN UA: NEGATIVE
GLUCOSE UA: NEGATIVE
Ketones, UA: NEGATIVE
NITRITE UA: NEGATIVE
Protein, UA: NEGATIVE
RBC UA: NEGATIVE
Spec Grav, UA: 1.01
UROBILINOGEN UA: 0.2
pH, UA: 6

## 2016-04-09 LAB — POCT GLYCOSYLATED HEMOGLOBIN (HGB A1C): Hemoglobin A1C: 5.5

## 2016-04-09 MED ORDER — RANITIDINE HCL 150 MG PO TABS: 150.0000 mg | ORAL_TABLET | Freq: Two times a day (BID) | ORAL | 3 refills | Status: AC

## 2016-04-09 MED ORDER — HYDROCHLOROTHIAZIDE 25 MG PO TABS: 25.0000 mg | ORAL_TABLET | Freq: Every day | ORAL | 1 refills | Status: AC

## 2016-04-09 NOTE — Telephone Encounter (Signed)
Call pt: since she is having dry mouth and urinary frequency and nos sign of diabetes, we can change her BP pill to something that is not a diuretic.  We could  Consider a clonidine patch?

## 2016-04-09 NOTE — Progress Notes (Signed)
Subjective:    CC: HTN  HPI: Hypertension- Pt denies chest pain, SOB, dizziness, or heart palpitations.  Taking meds as directed w/o problems.  Denies medication side effects.    She would like to be tested for DM.  She has had pain on the sottoms of her feet, dry mouth, and urinating frequently. Urine has been clouldy and she has had some pressure with urination. No blood.  She has been walking barefoot most of the summer.   BP (!) 149/69   Pulse 73   Ht 5\' 5"  (1.651 m)   Wt 157 lb (71.2 kg)   SpO2 98%   BMI 26.13 kg/m     Allergies  Allergen Reactions  . Amlodipine Swelling    Bilateral lower extremity edema   . Bisphosphonates Other (See Comments)    Jaw necrosis, GERD  . Statins     Flu like symptoms, myalgias  . Losartan Rash  . Metoprolol Diarrhea and Rash    Past Medical History:  Diagnosis Date  . GERD (gastroesophageal reflux disease)   . Hyperlipidemia   . Hypertension   . 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 Vitamin D deficiency     Past Surgical History:  Procedure Laterality Date  . TONSILLECTOMY      Social History   Social History  . Marital status: Married    Spouse name: N/A  . Number of children: N/A  . Years of education: N/A   Occupational History  . Not on file.   Social History Main Topics  . Smoking status: Former Smoker    Types: Cigarettes    Quit date: 07/29/1980  . Smokeless tobacco: Not on file  . Alcohol use No  . Drug use: No  . Sexual activity: Not on file   Other Topics Concern  . Not on file   Social History Narrative  . No narrative on file    Family History  Problem Relation Age of Onset  . Heart disease Mother 23    chf  . Hyperlipidemia Father   . Diabetes Father     Outpatient Encounter Prescriptions as of 04/09/2016  Medication Sig  . Cholecalciferol (VITAMIN D) 1000 UNITS capsule Take 1 capsule (1,000 Units total) by mouth daily.  . hydrochlorothiazide (HYDRODIURIL) 25 MG  tablet Take 1 tablet by mouth  daily  . ranitidine (ZANTAC) 150 MG tablet TAKE 1 TABLET TWICE DAILY   No facility-administered encounter medications on file as of 04/09/2016.        Review of Systems: No fevers, chills, night sweats, weight loss, chest pain, or shortness of breath.   Objective:    General: Well Developed, well nourished, and in no acute distress.  Neuro: Alert and oriented x3, extra-ocular muscles intact, sensation grossly intact.  HEENT: Normocephalic, atraumatic  Skin: Warm and dry, no rashes. Cardiac: Regular rate and rhythm, no murmurs rubs or gallops, no lower extremity edema.  Respiratory: Clear to auscultation bilaterally. Not using accessory muscles, speaking in full sentences.   Impression and Recommendations:    HTN - Well controlled. Continue current regimen. Follow up in  6 mo. Due for BMP  Urinary frequency - will do A1C to eval for DM. Will check for UA as well.  A1C is normal. No sign of diabetes. UA shows leuks so will send for culture.   Lab Results  Component Value Date   HGBA1C 5.5 04/09/2016   Bilateral foot pain-no specimen 5 location for pain.  Suspect that it's probably from walking on hard floors barefoot most of the summer. Encouraged her to walk around with good supportive shoe wear and see if the pain improves. If not and we can consider getting an her in with one of our sports medicine docs for further evaluation.

## 2016-04-10 LAB — URINE CULTURE: ORGANISM ID, BACTERIA: NO GROWTH

## 2016-04-10 NOTE — Telephone Encounter (Signed)
Pt stated that she is going to continue with medication for now. She will call back if she decides to do something different.Sierra [name]

## 2016-10-10 ENCOUNTER — Encounter: Payer: Self-pay | Admitting: Osteopathic Medicine

## 2016-10-10 ENCOUNTER — Ambulatory Visit (INDEPENDENT_AMBULATORY_CARE_PROVIDER_SITE_OTHER): Payer: Medicare Other

## 2016-10-10 ENCOUNTER — Ambulatory Visit (INDEPENDENT_AMBULATORY_CARE_PROVIDER_SITE_OTHER): Payer: Medicare Other | Admitting: Osteopathic Medicine

## 2016-10-10 ENCOUNTER — Ambulatory Visit (HOSPITAL_BASED_OUTPATIENT_CLINIC_OR_DEPARTMENT_OTHER)
Admission: RE | Admit: 2016-10-10 | Discharge: 2016-10-10 | Disposition: A | Discharge status: Home / Self Care | Payer: Medicare Other | Source: Ambulatory Visit | Attending: Osteopathic Medicine | Admitting: Osteopathic Medicine

## 2016-10-10 VITALS — BP 156/86 | HR 77 | Ht 65.0 in | Wt 156.0 lb

## 2016-10-10 DIAGNOSIS — I700 Atherosclerosis of aorta: Secondary | ICD-10-CM | POA: Diagnosis not present

## 2016-10-10 DIAGNOSIS — R110 Nausea: Secondary | ICD-10-CM | POA: Diagnosis not present

## 2016-10-10 DIAGNOSIS — R1011 Right upper quadrant pain: Secondary | ICD-10-CM | POA: Insufficient documentation

## 2016-10-10 DIAGNOSIS — M4856XA Collapsed vertebra, not elsewhere classified, lumbar region, initial encounter for fracture: Secondary | ICD-10-CM | POA: Diagnosis not present

## 2016-10-10 DIAGNOSIS — K2970 Gastritis, unspecified, without bleeding: Secondary | ICD-10-CM | POA: Diagnosis not present

## 2016-10-10 DIAGNOSIS — K5730 Diverticulosis of large intestine without perforation or abscess without bleeding: Secondary | ICD-10-CM | POA: Insufficient documentation

## 2016-10-10 DIAGNOSIS — R109 Unspecified abdominal pain: Secondary | ICD-10-CM

## 2016-10-10 LAB — COMPLETE METABOLIC PANEL WITH GFR
ALBUMIN: 4.5 g/dL (ref 3.6–5.1)
ALK PHOS: 55 U/L (ref 33–130)
ALT: 13 U/L (ref 6–29)
AST: 16 U/L (ref 10–35)
BUN: 15 mg/dL (ref 7–25)
CALCIUM: 10.1 mg/dL (ref 8.6–10.4)
CO2: 27 mmol/L (ref 20–31)
Chloride: 105 mmol/L (ref 98–110)
Creat: 0.92 mg/dL (ref 0.60–0.93)
GFR, EST NON AFRICAN AMERICAN: 61 mL/min (ref >=60)
GFR, Est African American: 70 mL/min (ref >=60)
Glucose, Bld: 92 mg/dL (ref 65–99)
POTASSIUM: 4 mmol/L (ref 3.5–5.3)
Sodium: 141 mmol/L (ref 135–146)
Total Bilirubin: 0.6 mg/dL (ref 0.2–1.2)
Total Protein: 7 g/dL (ref 6.1–8.1)

## 2016-10-10 LAB — CBC WITH DIFFERENTIAL/PLATELET
BASOS ABS: 92 {cells}/uL (ref 0–200)
Basophils Relative: 1 %
EOS ABS: 184 {cells}/uL (ref 15–500)
Eosinophils Relative: 2 %
HEMATOCRIT: 44.3 % (ref 35.0–45.0)
HEMOGLOBIN: 15.4 g/dL (ref 11.7–15.5)
LYMPHS ABS: 2484 {cells}/uL (ref 850–3900)
Lymphocytes Relative: 27 %
MCH: 30.6 pg (ref 27.0–33.0)
MCHC: 34.8 g/dL (ref 32.0–36.0)
MCV: 87.9 fL (ref 80.0–100.0)
MONO ABS: 828 {cells}/uL (ref 200–950)
MPV: 9.8 fL (ref 7.5–12.5)
Monocytes Relative: 9 %
NEUTROS PCT: 61 %
Neutro Abs: 5612 cells/uL (ref 1500–7800)
Platelets: 318 10*3/uL (ref 140–400)
RBC: 5.04 MIL/uL (ref 3.80–5.10)
RDW: 13.5 % (ref 11.0–15.0)
WBC: 9.2 10*3/uL (ref 3.8–10.8)

## 2016-10-10 LAB — POCT URINALYSIS DIPSTICK
BILIRUBIN UA: NEGATIVE
Blood, UA: NEGATIVE
GLUCOSE UA: NEGATIVE
KETONES UA: NEGATIVE
Leukocytes, UA: NEGATIVE
Nitrite, UA: NEGATIVE
PROTEIN UA: NEGATIVE
SPEC GRAV UA: 1.005
Urobilinogen, UA: 0.2
pH, UA: 6

## 2016-10-10 MED ORDER — IOPAMIDOL (ISOVUE-300) INJECTION 61%
100.0000 mL | Freq: Once | INTRAVENOUS | Status: AC | PRN
  Administered 2016-10-10: 100 mL via INTRAVENOUS

## 2016-10-10 MED ORDER — PANTOPRAZOLE SODIUM 20 MG PO TBEC: 20.0000 mg | DELAYED_RELEASE_TABLET | Freq: Two times a day (BID) | ORAL | 0 refills | Status: AC

## 2016-10-10 MED ORDER — NAPROXEN 375 MG PO TBEC: 375.0000 mg | DELAYED_RELEASE_TABLET | Freq: Two times a day (BID) | ORAL | 0 refills | Status: AC

## 2016-10-10 NOTE — Patient Instructions (Addendum)
Plan:  Ultrasound and labs to evaluate the gallbladder - we should have results back later today  If looks like an infection, we may need to send you to the ER for treatment with antibiotics and emergent surgical consultation, but I'm hoping we can follow-up outpatient with an elective consultation either with GI or with General Surgery, depending on Ultrasound results.   If you develop severe pain, fever, or other concerns please seek care ASAP.

## 2016-10-10 NOTE — Progress Notes (Signed)
HPI: Sierra [name] is a 76 y.o. female  who presents to [place] today, 10/10/16,  for chief complaint of:  Chief Complaint  Patient presents with  . Abdominal Pain     . Location: RUQ into R mid/low back  . Quality: sharp-ish pain . Severity: severe but not unbearable . Duration: started 5-6 days ago  . Timing: constant . Modifying factors: worse w/ coughing, eating fatty or acidic foods . Assoc signs/symptoms: no fever, no nausea, no diarrhea or constipation    Past medical history, surgical history, social history and family history reviewed.  Patient Active Problem List   Diagnosis Date Noted  . Urinary frequency 04/09/2016  . Increased thirst 04/09/2016  . Dermatofibroma 01/08/2016  . AK (actinic keratosis) 12/05/2015  . Fracture of fifth metatarsal bone 02/28/2015  . Essential hypertension, benign 10/08/2013  . Reflux 10/08/2013  . Basal cell cancer 06/17/2011  . Squamous cell carcinoma 03/27/2011  . UNSPECIFIED VITAMIN D DEFICIENCY 10/15/2010  . PAIN IN THORACIC SPINE 10/08/2010  . Hyperlipidemia 09/27/2009  . FATIGUE 09/27/2009  . OSTEOPENIA 08/14/2009    Current medication list and allergy/intolerance information reviewed.   Current Outpatient Prescriptions on File Prior to Visit  Medication Sig Dispense Refill  . Cholecalciferol (VITAMIN D) 1000 UNITS capsule Take 1 capsule (1,000 Units total) by mouth daily. 30 capsule 6  . hydrochlorothiazide (HYDRODIURIL) 25 MG tablet Take 1 tablet (25 mg total) by mouth daily. 90 tablet 1  . ranitidine (ZANTAC) 150 MG tablet Take 1 tablet (150 mg total) by mouth 2 (two) times daily. 180 tablet 3   No current facility-administered medications on file prior to visit.    Allergies  Allergen Reactions  . Amlodipine Swelling    Bilateral lower extremity edema   . Bisphosphonates Other (See Comments)    Jaw necrosis, GERD  . Statins Other (See Comments)    Muscle aches and flu-like  symptoms Flu like symptoms, myalgias  . Losartan Rash  . Metoprolol Diarrhea and Rash      Review of Systems:  Constitutional: No recent illness  Cardiac: No  chest pain  Respiratory:  No  shortness of breath. No  Cough  Gastrointestinal: +abdominal pain, no change on bowel habits, no GERD  Musculoskeletal: No new myalgia/arthralgia  Skin: No  Rash  Hem/Onc: No  easy bruising/bleeding, No  abnormal lumps/bumps  Neurologic: No  weakness, No  Dizziness   Exam:  BP (!) 156/86   Pulse 77   Ht _0  (1.651 m)   Wt 156 lb (70.8 kg)   BMI 25.96 kg/m   Constitutional: VS see above. General Appearance: alert, well-developed, well-nourished, NAD  Eyes: Normal lids and conjunctive, non-icteric sclera  Ears, Nose, Mouth, Throat: MMM, Normal external inspection ears/nares/mouth/lips/gums.  Neck: No masses, trachea midline.   Respiratory: Normal respiratory effort. no wheeze, no rhonchi, no rales  Cardiovascular: S1/S2 normal, no murmur, no rub/gallop auscultated. RRR.   Musculoskeletal: Gait normal. Symmetric and independent movement of all extremities  Abdominal: BX wnl x4, (+)exquisite TTP RUQ, no other tenderness, nondistended, no rebound  Neurological: Normal balance/coordination. No tremor.  Skin: warm, dry, intact.   Psychiatric: Normal judgment/insight. Normal mood and affect. Oriented x3.    Results for orders placed or performed in visit on 10/10/16 (from the past 48 hour(s))  POCT Urinalysis Dipstick     Status: None   Collection Time: 10/10/16 11:26 AM  Result Value Ref Range   Color, UA YELLOW  Clarity, UA CLEAR    Glucose, UA NEGATIVE    Bilirubin, UA NEGATIVE    Ketones, UA NEGATIVE    Spec Grav, UA 1.005 1.003, 1.005, 1.010, 1.015, 1.020, 1.025, 1.030, 1.035   Blood, UA NEGATIVE    pH, UA 6.0 5.0, 5.5, 6.0, 6.5, 7.0, 7.5, 8.0   Protein, UA NEGATIVE    Urobilinogen, UA 0.2 0.2, 1.0, negative   Nitrite, UA NEGATIVE    Leukocytes, UA Negative  Negative  CBC with Differential/Platelet     Status: None   Collection Time: 10/10/16 11:48 AM  Result Value Ref Range   WBC 9.2 3.8 - 10.8 K/uL   RBC 5.04 3.80 - 5.10 MIL/uL   Hemoglobin 15.4 11.7 - 15.5 g/dL   HCT 44.3 35.0 - 45.0 %   MCV 87.9 80.0 - 100.0 fL   MCH 30.6 27.0 - 33.0 pg   MCHC 34.8 32.0 - 36.0 g/dL   RDW 13.5 11.0 - 15.0 %   Platelets 318 140 - 400 K/uL   MPV 9.8 7.5 - 12.5 fL   Neutro Abs 5,612 1,500 - 7,800 cells/uL   Lymphs Abs 2,484 850 - 3,900 cells/uL   Monocytes Absolute 828 200 - 950 cells/uL   Eosinophils Absolute 184 15 - 500 cells/uL   Basophils Absolute 92 0 - 200 cells/uL   Neutrophils Relative % 61 %   Lymphocytes Relative 27 %   Monocytes Relative 9 %   Eosinophils Relative 2 %   Basophils Relative 1 %   Smear Review Criteria for review not met   COMPLETE METABOLIC PANEL WITH GFR     Status: None   Collection Time: 10/10/16 11:48 AM  Result Value Ref Range   Sodium 141 135 - 146 mmol/L   Potassium 4.0 3.5 - 5.3 mmol/L   Chloride 105 98 - 110 mmol/L   CO2 27 20 - 31 mmol/L   Glucose, Bld 92 65 - 99 mg/dL   BUN 15 7 - 25 mg/dL   Creat 0.92 0.60 - 0.93 mg/dL    Comment:   For patients > or = 77 years of age: The upper reference limit for Creatinine is approximately 13% higher for people identified as African-American.      Total Bilirubin 0.6 0.2 - 1.2 mg/dL   Alkaline Phosphatase 55 33 - 130 U/L   AST 16 10 - 35 U/L   ALT 13 6 - 29 U/L   Total Protein 7.0 6.1 - 8.1 g/dL   Albumin 4.5 3.6 - 5.1 g/dL   Calcium 10.1 8.6 - 10.4 mg/dL   GFR, Est African American 70 >=60 mL/min   GFR, Est Non African American 61 >=60 mL/min     Ct Abdomen Pelvis W Contrast  Result Date: 10/10/2016 CLINICAL DATA:  76 year old female with right-sided abdominal pain. EXAM: CT ABDOMEN AND PELVIS WITH CONTRAST TECHNIQUE: Multidetector CT imaging of the abdomen and pelvis was performed using the standard protocol following bolus administration of intravenous  contrast. CONTRAST:  123m ISOVUE-300 IOPAMIDOL (ISOVUE-300) INJECTION 61% COMPARISON:  None. FINDINGS: Lower chest: No acute abnormality. Hepatobiliary: The liver and gallbladder are unremarkable. There is no evidence of biliary dilatation. Pancreas: Unremarkable Spleen: Unremarkable Adrenals/Urinary Tract: Bilateral renal cortical atrophy noted. The kidneys, adrenal glands and bladder are otherwise unremarkable. Stomach/Bowel: Descending and sigmoid colonic diverticulosis noted without diverticulitis. There is no evidence of focal bowel wall thickening or bowel obstruction. The appendix is normal. Vascular/Lymphatic: Aortic atherosclerosis. No enlarged abdominal or pelvic lymph nodes. Reproductive: Uterine  fibroids noted.  No adnexal masses identified. Other: No free fluid, abscess or pneumoperitoneum. Musculoskeletal: A 50% compression fracture of the L5 superior endplate appears subacute but correlate with pain. Mild compression of the L1 superior endplate is age indeterminate. IMPRESSION: No evidence of acute abnormality within the abdomen or pelvis. 50% L5 superior endplate compression fracture -is appears acute but correlate clinically. Age indeterminate mild compression of the L1 superior endplate. Abdominal aortic atherosclerosis. Electronically Signed   By: Margarette Canada M.D.   On: 10/10/2016 17:37   US Abdomen Limited Ruq  Result Date: 10/10/2016 CLINICAL DATA:  Right upper quadrant pain and nausea EXAM: US ABDOMEN LIMITED - RIGHT UPPER QUADRANT COMPARISON:  None. FINDINGS: Gallbladder: No gallstones or wall thickening visualized. There is no pericholecystic fluid. No sonographic Murphy sign noted by sonographer. Common bile duct: Diameter: 2 mm. There is no intrahepatic or extrahepatic biliary duct dilatation. Liver: No focal lesion identified. There is generalized increased echogenicity with what appears to be focal fatty sparing near the gallbladder fossa. IMPRESSION: Overall increased liver  echogenicity, likely due to hepatic steatosis. While no focal liver lesions are evident, it must be cautioned that the sensitivity of ultrasound for detection of focal liver lesions is diminished in this circumstance. Suspect fatty sparing near the gallbladder fossa. Study otherwise unremarkable. Electronically Signed   By: Lowella Grip III M.D.   On: 10/10/2016 13:30       ASSESSMENT/PLAN:   RUQ pain - Initially suspected gallbladder disease but [place] ok and nothing obvious on CT on personal review, await radiology over-read. Labs reassuring.  - Plan: CBC with Differential/Platelet, COMPLETE METABOLIC PANEL WITH GFR, US Abdomen Limited RUQ, Lipase, CT ABDOMEN PELVIS W CONTRAST, CANCELED: US Abdomen Limited RUQ, CANCELED: CT ABDOMEN PELVIS W WO CONTRAST  Abdominal pain, unspecified abdominal location - Plan: POCT Urinalysis Dipstick    Patient Instructions  Plan:  Ultrasound and labs to evaluate the gallbladder - we should have results back later today  If looks like an infection, we may need to send you to the ER for treatment with antibiotics and emergent surgical consultation, but I'm hoping we can follow-up outpatient with an elective consultation either with GI or with General Surgery, depending on Ultrasound results.   If you develop severe pain, fever, or other concerns please seek care ASAP.     Follow-up plan: Return if symptoms worsen or fail to improve, and depending on results .  Visit summary with medication list and pertinent instructions was printed for patient to review, alert [place] if any changes needed. All questions at time of visit were answered - patient instructed to contact office with any additional concerns. ER/RTC precautions were reviewed with the patient and understanding verbalized.    Addendum:  Called patient to discuss results, will trial treatment for gastritis and pain control. RTC 1 week if no better, sooner if worse. May consider GI referral,  ?ERCP/MRCP?

## 2018-02-25 IMAGING — CT CT ABD-PELV W/ CM
2 of 5 series · 16 of 46 positions shown, 18 images · IV contrast (iopamidol)
Comparison: None.

CLINICAL DATA: 75-year-old female with right-sided abdominal pain.

EXAM:
CT ABDOMEN AND PELVIS WITH CONTRAST
TECHNIQUE: Multidetector CT imaging of the abdomen and pelvis was performed
using the standard protocol following bolus administration of
intravenous contrast.
CONTRAST:  100mL SZRO30-C77 IOPAMIDOL (SZRO30-C77) INJECTION 61%

[Series 2: axial st · axial · 0.98mm/px · z∈[-612,-198]mm · 13 of 93 slices shown, 15 images]
[im 5/93  soft-tissue]
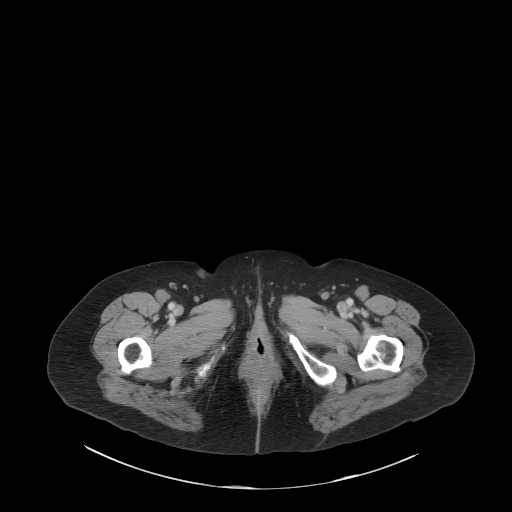
[im 5/93  bone]
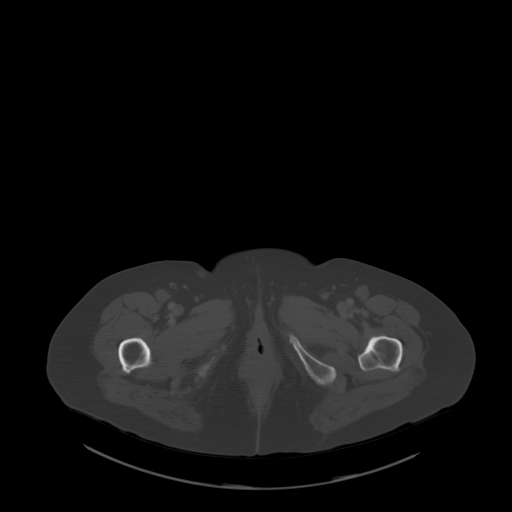
[im 15/93  soft-tissue]
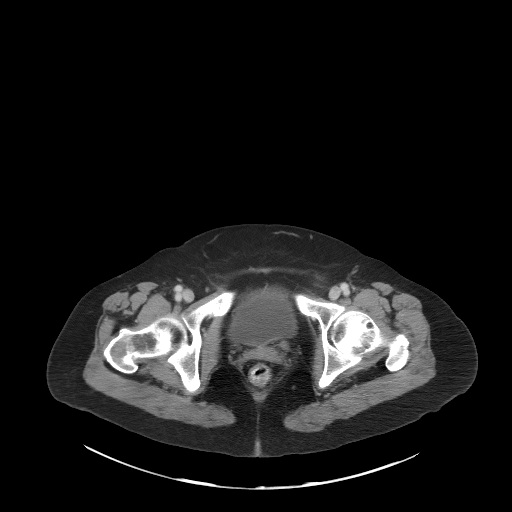
[im 20/93  soft-tissue]
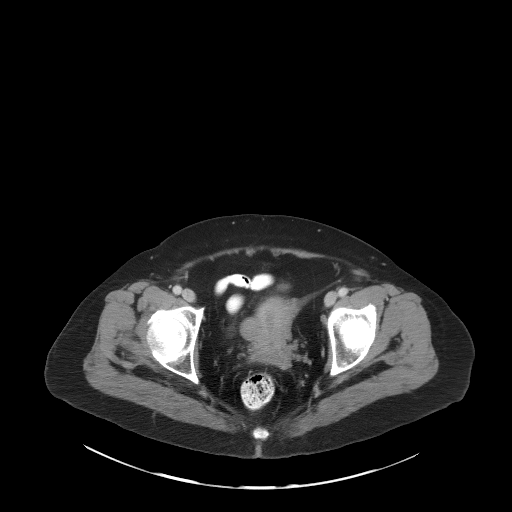
[im 25/93  soft-tissue]
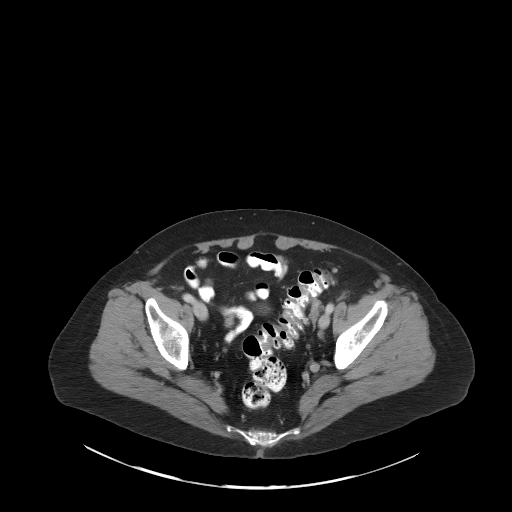
[im 34/93  soft-tissue]
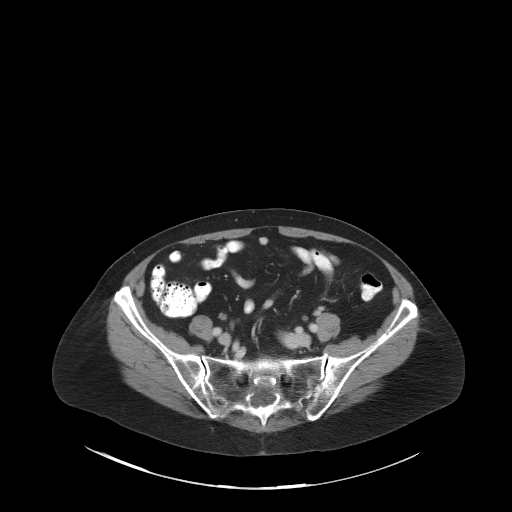
[im 39/93  soft-tissue]
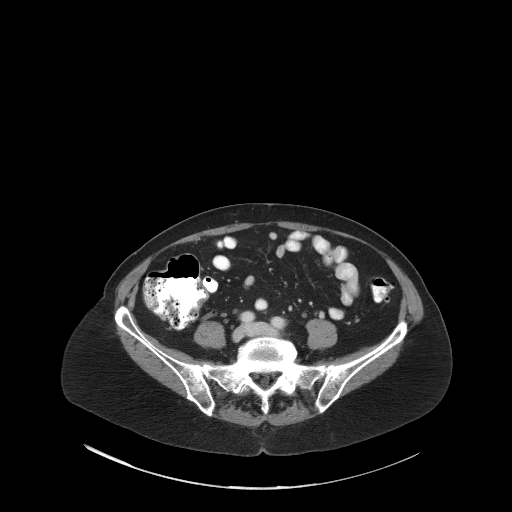
[im 49/93  soft-tissue]
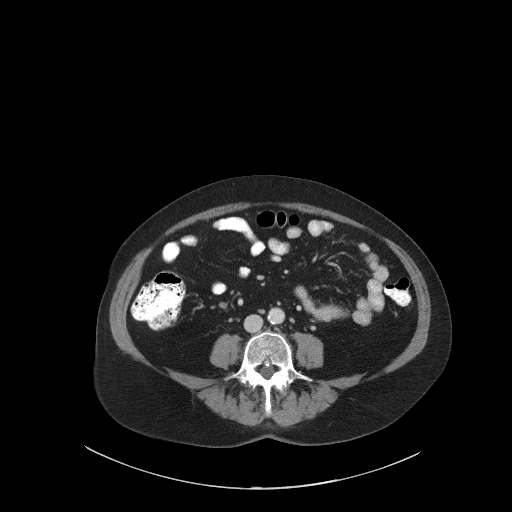
[im 54/93  soft-tissue]
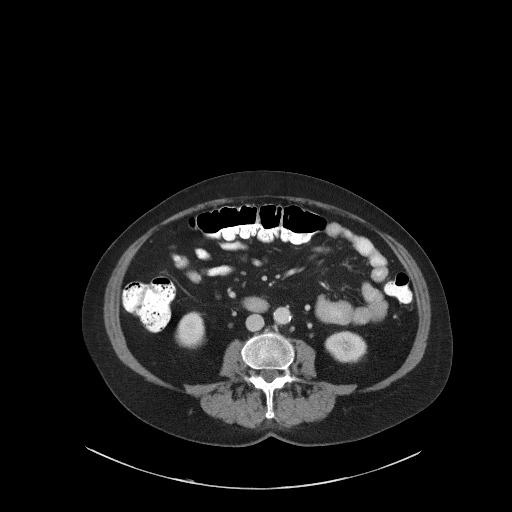
[im 59/93  soft-tissue]
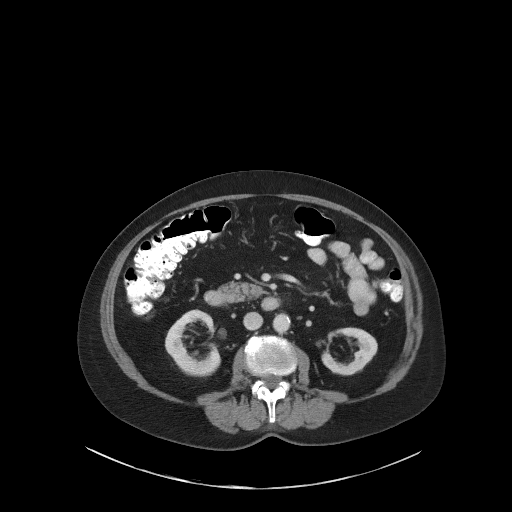
[im 59/93  bone]
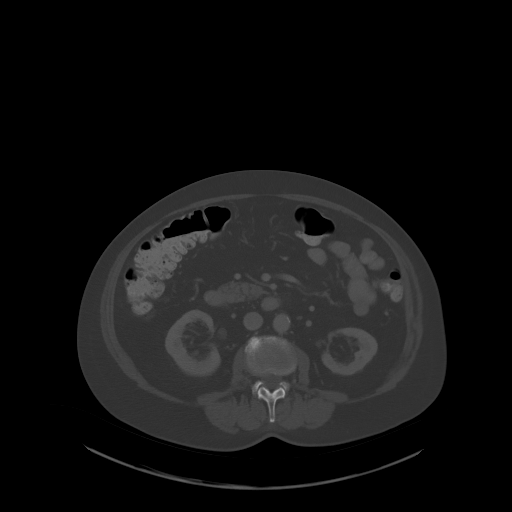
[im 68/93  soft-tissue]
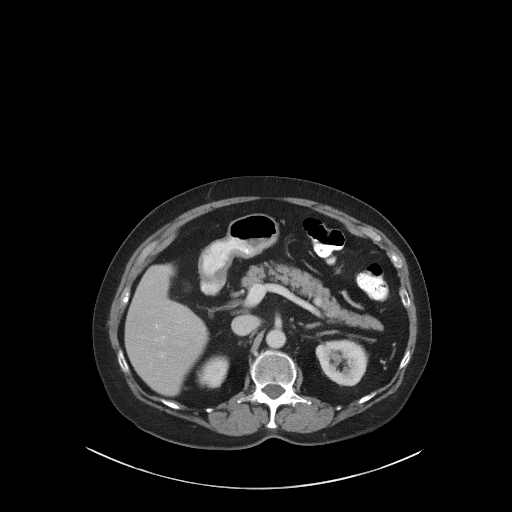
[im 73/93  soft-tissue]
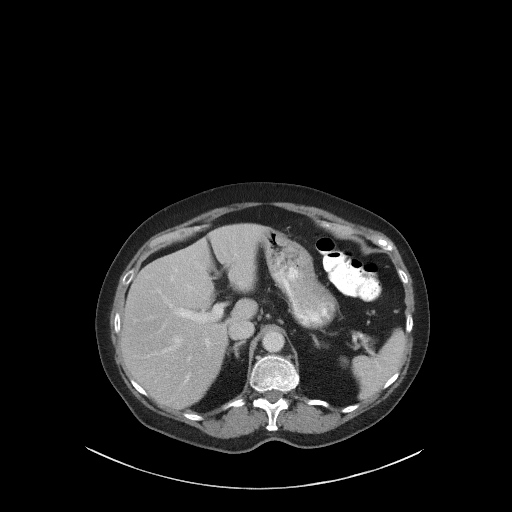
[im 78/93  soft-tissue]
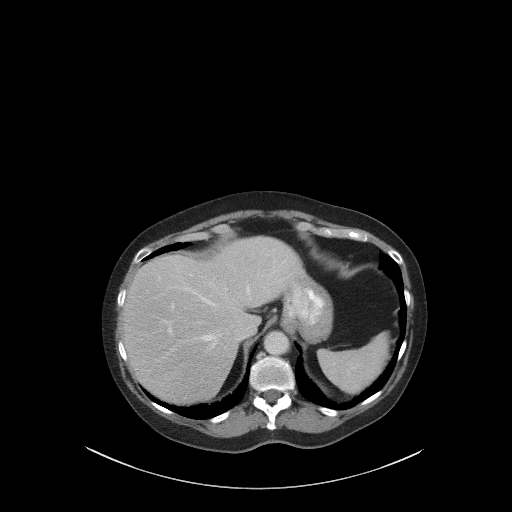
[im 88/93  soft-tissue]
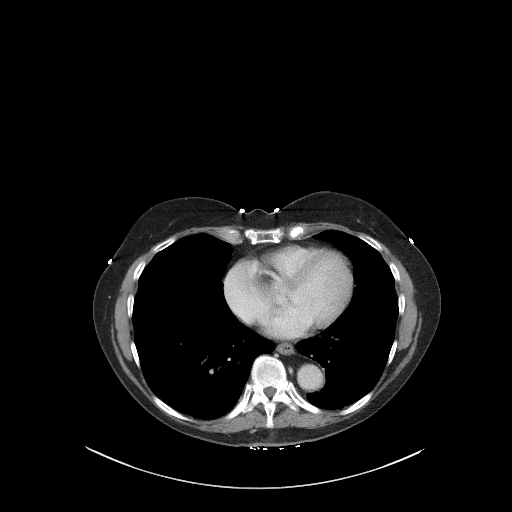

[Series 5: coronal st · coronal · 0.86mm/px · 3 of 83 slices shown]
[im 28/83  soft-tissue]
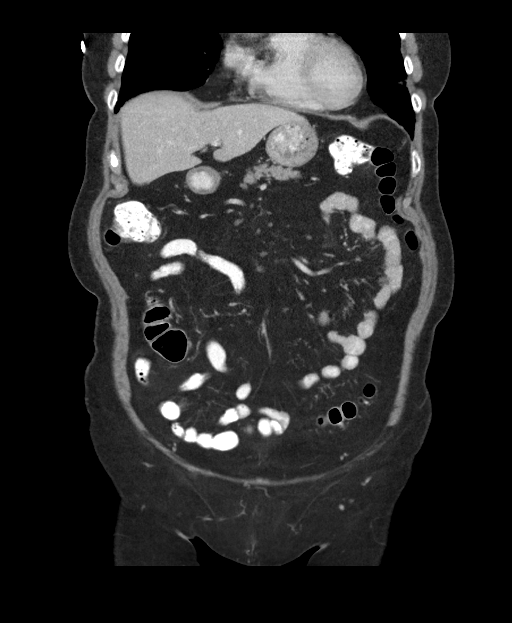
[im 37/83  soft-tissue]
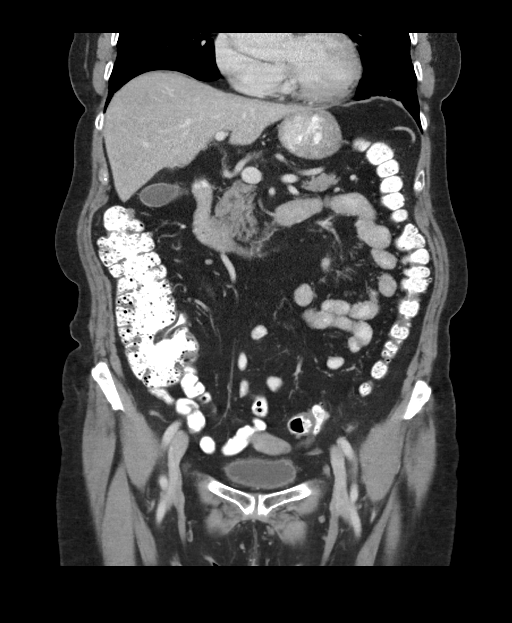
[im 46/83  soft-tissue]
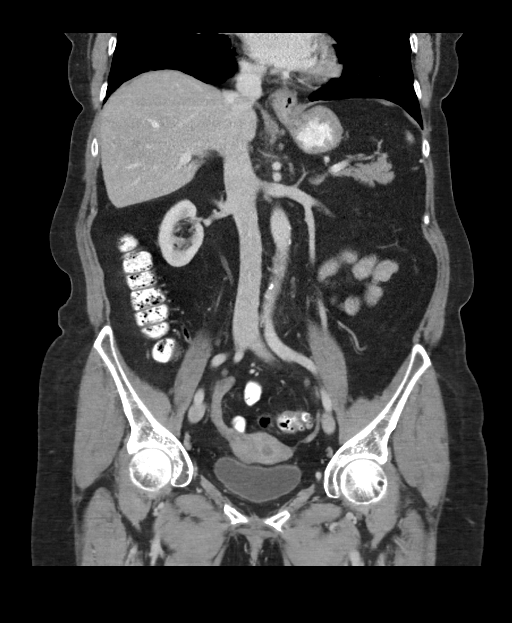

[16 of 46 positions shown; findings below may reference images not displayed]

FINDINGS: Lower chest: No acute abnormality.

Hepatobiliary: The liver and gallbladder are unremarkable. There is
no evidence of biliary dilatation.

Pancreas: Unremarkable

Spleen: Unremarkable

Adrenals/Urinary Tract: Bilateral renal cortical atrophy noted. The
kidneys, adrenal glands and bladder are otherwise unremarkable.

Stomach/Bowel: Descending and sigmoid colonic diverticulosis noted
without diverticulitis. There is no evidence of focal bowel wall
thickening or bowel obstruction. The appendix is normal.

Vascular/Lymphatic: Aortic atherosclerosis. No enlarged abdominal or
pelvic lymph nodes.

Reproductive: Uterine fibroids noted.  No adnexal masses identified.

Other: No free fluid, abscess or pneumoperitoneum.

Musculoskeletal: A 50% compression fracture of the L5 superior
endplate appears subacute but correlate with pain. Mild compression
of the L1 superior endplate is age indeterminate.
IMPRESSION: No evidence of acute abnormality within the abdomen or pelvis.

50% L5 superior endplate compression fracture -is appears acute but
correlate clinically.

Age indeterminate mild compression of the L1 superior endplate.

Abdominal aortic atherosclerosis.
